# Patient Record
Sex: Female | Born: 1985 | Race: White | Hispanic: No | Marital: Married | Smoking: Never smoker
Health system: Southern US, Community
[De-identification: ages and names within clinical notes are randomized; demographics above are authoritative.]

## PROBLEM LIST (undated history)

## (undated) DIAGNOSIS — G709 Myoneural disorder, unspecified: Secondary | ICD-10-CM

## (undated) DIAGNOSIS — K219 Gastro-esophageal reflux disease without esophagitis: Secondary | ICD-10-CM

## (undated) DIAGNOSIS — Z8041 Family history of malignant neoplasm of ovary: Secondary | ICD-10-CM

## (undated) DIAGNOSIS — Z803 Family history of malignant neoplasm of breast: Secondary | ICD-10-CM

## (undated) DIAGNOSIS — T7840XA Allergy, unspecified, initial encounter: Secondary | ICD-10-CM

## (undated) HISTORY — DX: Family history of malignant neoplasm of ovary: Z80.41

## (undated) HISTORY — PX: OTHER SURGICAL HISTORY: SHX169

## (undated) HISTORY — DX: Family history of malignant neoplasm of breast: Z80.3

## (undated) HISTORY — PX: WISDOM TOOTH EXTRACTION: SHX21

## (undated) HISTORY — DX: Myoneural disorder, unspecified: G70.9

## (undated) HISTORY — DX: Allergy, unspecified, initial encounter: T78.40XA

## (undated) HISTORY — DX: Gastro-esophageal reflux disease without esophagitis: K21.9

---

## 2008-05-17 ENCOUNTER — Encounter: Admission: RE | Admit: 2008-05-17 | Discharge: 2008-05-17 | Payer: Self-pay | Admitting: Unknown Physician Specialty

## 2013-03-05 LAB — HIV 1/O/2 ABS, QUAL: 001726: NEGATIVE

## 2016-01-27 ENCOUNTER — Ambulatory Visit: Payer: Self-pay | Admitting: Osteopathic Medicine

## 2016-02-03 ENCOUNTER — Encounter: Payer: Self-pay | Admitting: Osteopathic Medicine

## 2016-02-03 ENCOUNTER — Telehealth: Payer: Self-pay

## 2016-02-03 ENCOUNTER — Ambulatory Visit (INDEPENDENT_AMBULATORY_CARE_PROVIDER_SITE_OTHER): Payer: 59 | Admitting: Osteopathic Medicine

## 2016-02-03 VITALS — BP 117/80 | HR 66 | Ht 62.0 in | Wt 154.0 lb

## 2016-02-03 DIAGNOSIS — Z8669 Personal history of other diseases of the nervous system and sense organs: Secondary | ICD-10-CM | POA: Diagnosis not present

## 2016-02-03 DIAGNOSIS — S39012A Strain of muscle, fascia and tendon of lower back, initial encounter: Secondary | ICD-10-CM

## 2016-02-03 DIAGNOSIS — Z0000 Encounter for general adult medical examination without abnormal findings: Secondary | ICD-10-CM

## 2016-02-03 DIAGNOSIS — Z87898 Personal history of other specified conditions: Secondary | ICD-10-CM

## 2016-02-03 DIAGNOSIS — X503XXA Overexertion from repetitive movements, initial encounter: Secondary | ICD-10-CM | POA: Insufficient documentation

## 2016-02-03 NOTE — Progress Notes (Signed)
HPI: Christine Fuentes is a 30 y.o. female  who presents to [facility] today, 02/03/16,  for chief complaint of:  Chief Complaint  Patient presents with  . Establish Care    herniated disc    New patient here to establish care. No major issues to discuss at this point. Reports intermittent back pain. History of herniated disc which was treated with injections in college, upper lumbars, currently lower lumbar/sacral area bothers her. 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 Spends a lot of time at her desk/sitting. Finds that picking up her small daughter causes pain every now and then. Typically relieved results the pain, she is currently using about 1 per day   Previous records reviewed: [place] 4+ VIEW Comparison: None Findings: There are six non-rib bearing vertebral bodies.  Thoracic and cervical spine views may be required for accurate vertebral body level numbering. Anatomic alignment.  No acute fracture or dislocation.  No pars defect. IMPRESSION: No acute bony injury.  Transitional anatomy is noted as delineated above.    Reports persistent vertigo, is following with [facility] Ball and throat for this issue, takes meclizine daily. Also taking Zyrtec daily, more for the reason being that it seems to help a bit with the vertigo then for uncontrolled allergies.   Past medical, surgical, social and family history reviewed: History reviewed. No pertinent past medical history. Past Surgical History:  Procedure Laterality Date  . CESAREAN SECTION     Social History  Substance Use Topics  . Smoking status: Never Smoker  . Smokeless tobacco: Never Used  . Alcohol use Not on file   Family History  Problem Relation Age of Onset  . Hypertension Mother   . Hypertension Father   . Cancer Brother   . Diabetes Maternal Grandfather      Current medication list and allergy/intolerance information reviewed:   No current  outpatient prescriptions on file.   No current facility-administered medications for this visit.    Allergies not on file    Review of Systems:  Constitutional:  No  fever, no chills, No recent illness, No unintentional weight changes. No significant fatigue.   HEENT: No  headache, no vision change, no hearing change, No sore throat, No  sinus pressure  Cardiac: No  chest pain, No  pressure, No palpitations, No  Orthopnea  Respiratory:  No  shortness of breath. No  Cough  Gastrointestinal: No  abdominal pain, No  nausea, No  vomiting,  No  blood in stool, No  diarrhea, No  constipation   Musculoskeletal: No new myalgia/arthralgia  Genitourinary: No  incontinence, No  abnormal genital bleeding, No abnormal genital discharge  Skin: No  Rash, No other wounds/concerning lesions  Hem/Onc: No  easy bruising/bleeding, No  abnormal lymph node  Endocrine: No cold intolerance,  No heat intolerance. No polyuria/polydipsia/polyphagia   Neurologic: No  weakness, No  dizziness, No  slurred speech/focal weakness/facial droop  Psychiatric: No  concerns with depression, No  concerns with anxiety, No sleep problems, No mood problems  Exam:  BP 117/80   Pulse 66   Ht 5\' 2"  (1.575 m)   Wt 154 lb (69.9 kg)   BMI 28.17 kg/m   Constitutional: VS see above. General Appearance: alert, well-developed, well-nourished, NAD  Eyes: Normal lids and conjunctive, non-icteric sclera  Ears, Nose, Mouth, Throat: MMM, Normal external inspection ears/nares/mouth/lips/gums. TM normal bilaterally. Pharynx/tonsils no erythema, no exudate. Nasal mucosa normal.  Neck: No masses, trachea midline. No thyroid enlargement. No tenderness/mass appreciated. No lymphadenopathy  Respiratory: Normal respiratory effort. no wheeze, no rhonchi, no rales  Cardiovascular: S1/S2 normal, no murmur, no rub/gallop auscultated. RRR. No lower extremity edema.   Gastrointestinal: Nontender, no masses. No hepatomegaly, no  splenomegaly. No hernia appreciated. Bowel sounds normal. Rectal exam deferred.   Musculoskeletal: Gait normal. No clubbing/cyanosis of digits.   Neurological: Normal balance/coordination. No tremor.   Skin: warm, dry, intact. No rash/ulcer. No concerning nevi or subq nodules on limited exam.    Psychiatric: Normal judgment/insight. Normal mood and affect. Oriented x3.     ASSESSMENT/PLAN:   Repetitive strain injury of lower back, initial encounter - Home exercises given, consider physical therapy. Postural adjustments discussed and proper lifting technique.  History of vertigo - Following with ENT. On meclizine and cetirizine, would advise discuss with ENT continuation of the antihistaminic agents together   Brief review of preventive care: Patient states up-to-date on Pap smear, STD testing, Tdap Vaccine, previously following with OB/GYN. Currently plans to try getting pregnant again  Visit summary with medication list and pertinent instructions was printed for patient to review. All questions at time of visit were answered - patient instructed to contact office with any additional concerns. ER/RTC precautions were reviewed with the patient. Follow-up plan: Return in about 1 year (around 02/02/2017) for annual physical, sooner if needed.

## 2016-02-03 NOTE — Telephone Encounter (Signed)
Ok to refill, would like records from ENT, however, 30 days supply ok

## 2016-02-03 NOTE — Telephone Encounter (Signed)
Patient called and would like to know if she can get a refill for Meclizine. She stated that she uses it daily for her Vertigo. Please advise if refill is approved. Christine Fuentes,CMA

## 2016-02-04 MED ORDER — MECLIZINE HCL 25 MG PO TABS: ORAL_TABLET | ORAL | 0 refills | Status: DC

## 2016-02-04 NOTE — Telephone Encounter (Signed)
Left detailed message on patient vm with advise as noted below. Mackenzee Becvar,CMA  

## 2016-03-03 ENCOUNTER — Other Ambulatory Visit: Payer: Self-pay | Admitting: Osteopathic Medicine

## 2016-04-28 ENCOUNTER — Other Ambulatory Visit: Payer: Self-pay | Admitting: Osteopathic Medicine

## 2016-06-01 ENCOUNTER — Other Ambulatory Visit: Payer: Self-pay | Admitting: Osteopathic Medicine

## 2016-07-07 ENCOUNTER — Other Ambulatory Visit: Payer: Self-pay | Admitting: Osteopathic Medicine

## 2016-08-16 ENCOUNTER — Other Ambulatory Visit: Payer: Self-pay | Admitting: Osteopathic Medicine

## 2016-09-14 ENCOUNTER — Other Ambulatory Visit: Payer: Self-pay | Admitting: Osteopathic Medicine

## 2016-10-14 ENCOUNTER — Other Ambulatory Visit: Payer: Self-pay | Admitting: Osteopathic Medicine

## 2017-04-14 ENCOUNTER — Ambulatory Visit (INDEPENDENT_AMBULATORY_CARE_PROVIDER_SITE_OTHER): Payer: 59 | Admitting: Osteopathic Medicine

## 2017-04-14 ENCOUNTER — Encounter: Payer: Self-pay | Admitting: Osteopathic Medicine

## 2017-04-14 VITALS — BP 113/69 | HR 88 | Ht 62.0 in | Wt 185.0 lb

## 2017-04-14 DIAGNOSIS — H029 Unspecified disorder of eyelid: Secondary | ICD-10-CM

## 2017-04-14 NOTE — Progress Notes (Signed)
HPI: Christine Fuentes is a 31 y.o. female who  has no past medical history on file.  she presents to [facility] today, 04/14/17,  for chief complaint of:  Chief Complaint  Patient presents with  . Eye Problem     . Context: no injury ir recent illness . Location: L lower lid . Quality:a bit red, was itchy now better, was larger now a bit better  . Assoc signs/symptoms: no eye drainage, no redness to eye itself, no vision change, no pain with eye movement.    Past medical, surgical, social and family history reviewed:  Patient Active Problem List   Diagnosis Date Noted  . Repetitive strain injury of lower back 02/03/2016  . History of vertigo 02/03/2016    Past Surgical History:  Procedure Laterality Date  . CESAREAN SECTION      Social History   Tobacco Use  . Smoking status: Never Smoker  . Smokeless tobacco: Never Used  Substance Use Topics  . Alcohol use: Not on file    Family History  Problem Relation Age of Onset  . Hypertension Mother   . Hypertension Father   . Cancer Brother   . Diabetes Maternal Grandfather      Current medication list and allergy/intolerance information reviewed:    Current Outpatient Medications  Medication Sig Dispense Refill  . cetirizine (ZYRTEC) 10 MG tablet Take 10 mg by mouth daily.    . meclizine (ANTIVERT) 25 MG tablet TAKE 1 TABLET (25 MG TOTAL) BY MOUTH 3 (THREE) TIMES A DAY AS NEEDED. 30 tablet 0   No current facility-administered medications for this visit.     No Known Allergies    Review of Systems:  Constitutional:  No  fever, no chills, No recent illness  HEENT: No  headache, no vision change, no hearing change, No sore throat, No  sinus pressure  Cardiac: No  chest pain, No  pressure  Respiratory:  No  shortness of breath. No  Cough  Skin: +Rash, No other wounds/concerning lesions  Neurologic: No  weakness, No  Dizziness    Exam:  BP 113/69   Pulse 88   Ht 5\' 2"   (1.575 m)   Wt 185 lb (83.9 kg)   BMI 33.84 kg/m   Constitutional: VS see above. General Appearance: alert, well-developed, well-nourished, NAD  Eyes: Normal conjunctiva, non-icteric sclera, EOMI, PERRLA. Small area of mild erythema w/o edema at L lower eyelid at lash line    Ears, Nose, Mouth, Throat: MMM, Normal external inspection ears/nares/mouth/lips/gums. TM normal bilaterally. Pharynx/tonsils no erythema, no exudate. Nasal mucosa normal.   Neck: No masses, trachea midline. No thyroid enlargement. No tenderness/mass appreciated. No lymphadenopathy  Respiratory: Normal respiratory effort.    ASSESSMENT/PLAN:   Eyelid abnormality    Patient Instructions  I think this is the beginning (or the end!) of a mild stye or small infection/inflammation of the gland that gives oil to the eyelashes.   Warm compresses 15 minutes at a time, 4 times per day or more  Keep eyelashes clean with warm water and baby shampoo twice per day  No makeup on lower eyelid until it's gone  If worse, call the clinic!     Visit summary with medication list and pertinent instructions was printed for patient to review. All questions at time of visit were answered - patient instructed to contact office with any additional concerns. ER/RTC precautions were reviewed with the patient. Follow-up plan: Return if symptoms worsen or fail  to improve.    Please note: voice recognition software was used to produce this document, and typos may escape review. Please contact Dr. Sheppard Coil for any needed clarifications.

## 2017-04-14 NOTE — Patient Instructions (Signed)
I think this is the beginning (or the end!) of a mild stye or small infection/inflammation of the gland that gives oil to the eyelashes.   Warm compresses 15 minutes at a time, 4 times per day or more  Keep eyelashes clean with warm water and baby shampoo twice per day  No makeup on lower eyelid until it's gone  If worse, call the clinic!

## 2017-04-15 ENCOUNTER — Encounter: Payer: Self-pay | Admitting: Osteopathic Medicine

## 2018-05-16 ENCOUNTER — Other Ambulatory Visit: Payer: Self-pay | Admitting: Osteopathic Medicine

## 2018-05-16 NOTE — Telephone Encounter (Signed)
OK to refill Should come see me for annual sometime next few months!

## 2018-05-16 NOTE — Telephone Encounter (Signed)
[facility] pharmacy requesting med refill meclizine. Last OV was 04/14/17. Pls advise, if refill is appropriate. Thanks.

## 2018-05-17 NOTE — Telephone Encounter (Signed)
Left a detailed vm msg for pt regarding med refill sent to local pharmacy. Aware of provider's note. Call back info for appt scheduling provided.

## 2018-05-30 ENCOUNTER — Encounter: Payer: Self-pay | Admitting: Gastroenterology

## 2018-05-30 ENCOUNTER — Ambulatory Visit (INDEPENDENT_AMBULATORY_CARE_PROVIDER_SITE_OTHER): Payer: 59 | Admitting: Osteopathic Medicine

## 2018-05-30 ENCOUNTER — Encounter: Payer: Self-pay | Admitting: Osteopathic Medicine

## 2018-05-30 VITALS — BP 126/76 | HR 71 | Temp 98.7°F | Wt 133.4 lb

## 2018-05-30 DIAGNOSIS — Z124 Encounter for screening for malignant neoplasm of cervix: Secondary | ICD-10-CM

## 2018-05-30 DIAGNOSIS — Z0000 Encounter for general adult medical examination without abnormal findings: Secondary | ICD-10-CM | POA: Diagnosis not present

## 2018-05-30 DIAGNOSIS — Z800 Family history of malignant neoplasm of digestive organs: Secondary | ICD-10-CM | POA: Diagnosis not present

## 2018-05-30 DIAGNOSIS — R229 Localized swelling, mass and lump, unspecified: Secondary | ICD-10-CM | POA: Diagnosis not present

## 2018-05-30 NOTE — Progress Notes (Signed)
HPI: Christine Fuentes is a 33 y.o. female who  has no past medical history on file.  she presents to [facility] today, 05/30/18,  for chief complaint of: Annual physical Lump on abdomen    Patient here for annual physical / wellness exam.  See preventive care reviewed as below.   Additional concerns today include:   Concern about a lump on L abdomen x2 weeks, nonpainful, no growth   Brother deceased from colon cancer. Grandmother had breast and ?ovrian cancer .      Past medical, surgical, social and family history reviewed:  Patient Active Problem List   Diagnosis Date Noted  . Repetitive strain injury of lower back 02/03/2016  . History of vertigo 02/03/2016    Past Surgical History:  Procedure Laterality Date  . CESAREAN SECTION      Social History   Tobacco Use  . Smoking status: Never Smoker  . Smokeless tobacco: Never Used  Substance Use Topics  . Alcohol use: Not on file    Family History  Problem Relation Age of Onset  . Hypertension Mother   . Hypertension Father   . Cancer Brother   . Diabetes Maternal Grandfather      Current medication list and allergy/intolerance information reviewed:    Current Outpatient Medications  Medication Sig Dispense Refill  . cetirizine (ZYRTEC) 10 MG tablet Take 10 mg by mouth daily.    . meclizine (ANTIVERT) 25 MG tablet TAKE 1 TABLET (25 MG TOTAL) BY MOUTH 3 (THREE) TIMES A DAY AS NEEDED. 30 tablet 0   No current facility-administered medications for this visit.     No Known Allergies    Review of Systems:  Constitutional:  No  fever, no chills, No recent illness, No unintentional weight changes. No significant fatigue.   HEENT: No  headache, no vision change, no hearing change, No sore throat, No  sinus pressure  Cardiac: No  chest pain, No  pressure, No palpitations, No  Orthopnea  Respiratory:  No  shortness of breath. No  Cough  Gastrointestinal: No  abdominal  pain, No  nausea, No  vomiting,  No  blood in stool, No  diarrhea, No  constipation   Musculoskeletal: No new myalgia/arthralgia  Skin: No  Rash, No other wounds/concerning lesions  Genitourinary: No  incontinence, No  abnormal genital bleeding, No abnormal genital discharge  Hem/Onc: No  easy bruising/bleeding, No  abnormal lymph node, +bump as per HPI on abdomen   Endocrine: No cold intolerance,  No heat intolerance. No polyuria/polydipsia/polyphagia   Neurologic: No  weakness, No  dizziness, No  slurred speech/focal weakness/facial droop  Psychiatric: No  concerns with depression, No  concerns with anxiety, No sleep problems, No mood problems  Exam:  BP 126/76 (BP Location: Left Arm, Patient Position: Sitting, Cuff Size: Normal)   Pulse 71   Temp 98.7 F (37.1 C) (Oral)   Wt 133 lb 6.4 oz (60.5 kg)   BMI 24.40 kg/m   Constitutional: VS see above. General Appearance: alert, well-developed, well-nourished, NAD  Eyes: Normal lids and conjunctive, non-icteric sclera  Ears, Nose, Mouth, Throat: MMM, Normal external inspection ears/nares/mouth/lips/gums. TM normal bilaterally. Pharynx/tonsils no erythema, no exudate. Nasal mucosa normal.   Neck: No masses, trachea midline. No thyroid enlargement. No tenderness/mass appreciated. No lymphadenopathy  Respiratory: Normal respiratory effort. no wheeze, no rhonchi, no rales  Cardiovascular: S1/S2 normal, no murmur, no rub/gallop auscultated. RRR. No lower extremity edema.   Gastrointestinal: Nontender. No hepatomegaly, no  splenomegaly. No hernia appreciated. Bowel sounds normal. Rectal exam deferred. Small blueberry-sized subq round mass LLQ above ASIS, faint healing bruise superficial to this, nontender, no discoloration.   Musculoskeletal: Gait normal. No clubbing/cyanosis of digits.   Neurological: Normal balance/coordination. No tremor. No cranial nerve deficit on limited exam. Motor and sensation intact and symmetric. Cerebellar  reflexes intact.   Skin: warm, dry, intact. No rash/ulcer. No concerning nevi or subq nodules on limited exam.    Psychiatric: Normal judgment/insight. Normal mood and affect. Oriented x3.    No results found for this or any previous visit (from the past 72 hour(s)).  No results found.   ASSESSMENT/PLAN: The primary encounter diagnosis was Annual physical exam. Diagnoses of Cervical cancer screening, Subcutaneous mass, and Family history of colon cancer were also pertinent to this visit.  Orders Placed This Encounter  Procedures  . US Abdomen Limited  . CBC  . COMPLETE METABOLIC PANEL WITH GFR  . Lipid panel  . Ambulatory referral to Gastroenterology      Patient Instructions  General Preventive Care  Most recent routine screening lipids/other labs: ordered today.   Everyone should have blood pressure checked once per year.   Tobacco: don't!   Alcohol: responsible moderation is ok for most adults - if you have concerns about your alcohol intake, please talk to me!   Exercise: as tolerated to reduce risk of cardiovascular disease and diabetes. Strength training will also prevent osteoporosis.   Mental health: if need for mental health care (medicines, counseling, other), or concerns about moods, please let me know!   Sexual health: if need for STD testing, or if concerns with libido/pain problems, please let me know! If you need to discuss your birth control options, please let me know!   Advanced Directive: Living Will and/or Healthcare Power of Attorney recommended for all adults, regardless of age or health.  Vaccines  Flu vaccine: recommended for almost everyone, every fall.   Shingles vaccine: Shingrix recommended after age 23.  Pneumonia vaccines: Prevnar and Pneumovax recommended after age 19, or sooner if certain medical conditions.  Tetanus booster: Tdap recommended every 10 years.   HPV vaccine: Gardasil recommended up to age 11 to prevent HPV-associated  diseases, including certain cancers.  Cancer screenings   Colon cancer screening: recommended for everyone at age 42, but some folks need a colonoscopy sooner if risk factors - will refer to GI  Breast cancer screening: mammogram per OBGYN   Cervical cancer screening: Pap per OBGYN  Lung cancer screening: not needed for non-smokers  Infection screenings . HIV: recommended screening at least once age 89-65, more often as needed. . Gonorrhea/Chlamydia: screening as needed, though many insurances ask that we perform testing for anyone on birth control. . Hepatitis C: recommended for anyone born 27-1963 . TB: certain at-risk populations, or depending on work requirements and/or travel history Other . Bone Density Test: recommended for women at age 33       Visit summary with medication list and pertinent instructions was printed for patient to review. All questions at time of visit were answered - patient instructed to contact office with any additional concerns or updates. ER/RTC precautions were reviewed with the patient.     Please note: voice recognition software was used to produce this document, and typos may escape review. Please contact Dr. Sheppard Coil for any needed clarifications.     Follow-up plan: Return in about 1 year (around 05/31/2019) for [place], sooner if needed .

## 2018-05-30 NOTE — Patient Instructions (Addendum)
General Preventive Care  Most recent routine screening lipids/other labs: ordered today.   Everyone should have blood pressure checked once per year.   Tobacco: don't!   Alcohol: responsible moderation is ok for most adults - if you have concerns about your alcohol intake, please talk to me!   Exercise: as tolerated to reduce risk of cardiovascular disease and diabetes. Strength training will also prevent osteoporosis.   Mental health: if need for mental health care (medicines, counseling, other), or concerns about moods, please let me know!   Sexual health: if need for STD testing, or if concerns with libido/pain problems, please let me know! If you need to discuss your birth control options, please let me know!   Advanced Directive: Living Will and/or Healthcare Power of Attorney recommended for all adults, regardless of age or health.  Vaccines  Flu vaccine: recommended for almost everyone, every fall.   Shingles vaccine: Shingrix recommended after age 80.  Pneumonia vaccines: Prevnar and Pneumovax recommended after age 15, or sooner if certain medical conditions.  Tetanus booster: Tdap recommended every 10 years.   HPV vaccine: Gardasil recommended up to age 43 to prevent HPV-associated diseases, including certain cancers.  Cancer screenings   Colon cancer screening: recommended for everyone at age 33, but some folks need a colonoscopy sooner if risk factors - will refer to GI  Breast cancer screening: mammogram per OBGYN   Cervical cancer screening: Pap per OBGYN  Lung cancer screening: not needed for non-smokers  Infection screenings . HIV: recommended screening at least once age 1-65, more often as needed. . Gonorrhea/Chlamydia: screening as needed, though many insurances ask that we perform testing for anyone on birth control. . Hepatitis C: recommended for anyone born 59-1963 . TB: certain at-risk populations, or depending on work requirements and/or travel  history Other . Bone Density Test: recommended for women at age 55

## 2018-06-06 ENCOUNTER — Encounter: Payer: 59 | Admitting: Osteopathic Medicine

## 2018-06-06 ENCOUNTER — Ambulatory Visit (HOSPITAL_BASED_OUTPATIENT_CLINIC_OR_DEPARTMENT_OTHER)
Admission: RE | Admit: 2018-06-06 | Discharge: 2018-06-06 | Disposition: A | Discharge status: Home / Self Care | Payer: 59 | Source: Ambulatory Visit | Attending: Osteopathic Medicine | Admitting: Osteopathic Medicine

## 2018-06-06 DIAGNOSIS — R229 Localized swelling, mass and lump, unspecified: Secondary | ICD-10-CM | POA: Diagnosis present

## 2018-06-06 LAB — CBC
HEMATOCRIT: 42 % (ref 35.0–45.0)
HEMOGLOBIN: 14.4 g/dL (ref 11.7–15.5)
MCH: 29.9 pg (ref 27.0–33.0)
MCHC: 34.3 g/dL (ref 32.0–36.0)
MCV: 87.3 fL (ref 80.0–100.0)
MPV: 10.9 fL (ref 7.5–12.5)
Platelets: 314 10*3/uL (ref 140–400)
RBC: 4.81 10*6/uL (ref 3.80–5.10)
RDW: 12.4 % (ref 11.0–15.0)
WBC: 4.9 10*3/uL (ref 3.8–10.8)

## 2018-06-06 LAB — LIPID PANEL
CHOL/HDL RATIO: 2.9 (calc) (ref <5.0)
CHOLESTEROL: 166 mg/dL (ref <200)
HDL: 58 mg/dL (ref >50)
LDL Cholesterol (Calc): 94 mg/dL (calc)
Non-HDL Cholesterol (Calc): 108 mg/dL (calc) (ref <130)
Triglycerides: 48 mg/dL (ref <150)

## 2018-06-06 LAB — COMPLETE METABOLIC PANEL WITH GFR
AG RATIO: 2.1 (calc) (ref 1.0–2.5)
ALBUMIN MSPROF: 4.5 g/dL (ref 3.6–5.1)
ALT: 9 U/L (ref 6–29)
AST: 10 U/L (ref 10–30)
Alkaline phosphatase (APISO): 39 U/L (ref 33–115)
BILIRUBIN TOTAL: 0.6 mg/dL (ref 0.2–1.2)
BUN: 14 mg/dL (ref 7–25)
CHLORIDE: 105 mmol/L (ref 98–110)
CO2: 27 mmol/L (ref 20–32)
Calcium: 9.7 mg/dL (ref 8.6–10.2)
Creat: 0.81 mg/dL (ref 0.50–1.10)
GFR, Est African American: 113 mL/min/{1.73_m2} (ref >=60)
GFR, Est Non African American: 97 mL/min/{1.73_m2} (ref >=60)
GLOBULIN: 2.1 g/dL (ref 1.9–3.7)
Glucose, Bld: 90 mg/dL (ref 65–99)
POTASSIUM: 4.7 mmol/L (ref 3.5–5.3)
SODIUM: 139 mmol/L (ref 135–146)
Total Protein: 6.6 g/dL (ref 6.1–8.1)

## 2018-06-16 ENCOUNTER — Other Ambulatory Visit: Payer: Self-pay | Admitting: Osteopathic Medicine

## 2018-06-16 ENCOUNTER — Ambulatory Visit (INDEPENDENT_AMBULATORY_CARE_PROVIDER_SITE_OTHER): Payer: 59 | Admitting: Gastroenterology

## 2018-06-16 ENCOUNTER — Encounter: Payer: Self-pay | Admitting: Gastroenterology

## 2018-06-16 VITALS — BP 114/74 | HR 99 | Ht 62.0 in | Wt 131.5 lb

## 2018-06-16 DIAGNOSIS — Z800 Family history of malignant neoplasm of digestive organs: Secondary | ICD-10-CM | POA: Diagnosis not present

## 2018-06-16 MED ORDER — SOD PICOSULFATE-MAG OX-CIT ACD 10-3.5-12 MG-GM -GM/160ML PO SOLN: 1.0000 | ORAL | 0 refills | Status: DC

## 2018-06-16 NOTE — Patient Instructions (Signed)
If you are age 31 or older, your body mass index should be between 23-30. Your Body mass index is 24.05 kg/m. If this is out of the aforementioned range listed, please consider follow up with your Primary Care Provider.  If you are age 8 or younger, your body mass index should be between 19-25. Your Body mass index is 24.05 kg/m. If this is out of the aformentioned range listed, please consider follow up with your Primary Care Provider.   Please call our office at [phone] to set up your Colonoscopy if you decided to proceed.  It was a pleasure to see you today!  Vito Cirigliano, D.O.

## 2018-06-16 NOTE — Progress Notes (Signed)
Chief Complaint: FHx of GI malignancy  Referring Provider:     Emeterio Reeve, DO  HPI:     Christine Fuentes is a 33 y.o. female with a strong FHX of [place] referred to the [facility] for evaluation of CRC screening. FHx n/f brother diagnosed at age 34 with appendical CA- Signet Ring Mucinous Adenocarcinoma. Died age 68 from mets.   She is otherwise without any GI symptoms.  She denies nausea, vomiting, diarrhea, constipation, hematochezia, melena, night sweats, fever, chills, weight loss, early satiety, dysphagia, odynophagia.  No previous EGD or colonoscopy.  Recent labs n/f normal CBC and CMP.   Family history: -Brother: Signet Ring Mucinous Adenocarcinoma, diagnosed age 37, deceased age 4 -MGM: Breast, Ovarian, Lung CA   History reviewed. No pertinent past medical history.   Past Surgical History:  Procedure Laterality Date  . CESAREAN SECTION     x2   Family History  Problem Relation Age of Onset  . Hypertension Mother   . Colonic polyp Mother   . Hypertension Father   . Colonic polyp Father   . Cancer Brother        appendix died at 78  . Diabetes Maternal Grandfather   . Breast cancer Maternal Grandmother   . Ovarian cancer Maternal Grandmother   . Esophageal cancer Neg Hx    Social History   Tobacco Use  . Smoking status: Never Smoker  . Smokeless tobacco: Never Used  Substance Use Topics  . Alcohol use: Not Currently  . Drug use: Never   Current Outpatient Medications  Medication Sig Dispense Refill  . cetirizine (ZYRTEC) 10 MG tablet Take 10 mg by mouth daily.    Marland Kitchen levonorgestrel (MIRENA) 20 MCG/24HR IUD 1 each by Intrauterine route once. Placed 10/2016    . meclizine (ANTIVERT) 25 MG tablet TAKE 1 TABLET (25 MG TOTAL) BY MOUTH 3 (THREE) TIMES A DAY AS NEEDED. 30 tablet 0   No current facility-administered medications for this visit.    No Known Allergies   Review of Systems: All systems reviewed and negative except  where noted in HPI.     Physical Exam:    Wt Readings from Last 3 Encounters:  06/16/18 131 lb 8 oz (59.6 kg)  05/30/18 133 lb 6.4 oz (60.5 kg)  04/14/17 185 lb (83.9 kg)    BP 114/74   Pulse 99   Ht 5\' 2"  (1.575 m)   Wt 131 lb 8 oz (59.6 kg)   BMI 24.05 kg/m  Constitutional:  Pleasant, in no acute distress. Psychiatric: Normal mood and affect. Behavior is normal. EENT: Pupils normal.  Conjunctivae are normal. No scleral icterus. Neck supple. No cervical LAD. Cardiovascular: Normal rate, regular rhythm. No edema Pulmonary/chest: Effort normal and breath sounds normal. No wheezing, rales or rhonchi. Abdominal: Soft, nondistended, nontender. Bowel sounds active throughout. There are no masses palpable. No hepatomegaly. Neurological: Alert and oriented to person place and time. Skin: Skin is warm and dry. No rashes noted.   ASSESSMENT AND PLAN;   Christine Fuentes is a 33 y.o. 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  She is otherwise without GI symptoms.  Discussed her family history at length.  No clear consensus guidelines for screening family members with this type of malignancy.  However, given her brother's young age at diagnosis, the relative difficulty in establishing that diagnosis, and the  potential for unknown genetic factors that are yet to be discovered, I did offer consideration for colonoscopy.  Again, if this was a family history of CRC, the guidelines are clear and would be appropriate to start early screening now.  However, given the histology of his malignancy, there is no clear societal guideline, although there are emerging data to support the use of genetic and endoscopic screening, as outlined below.  She understood the conversation well (has a medical background as a Marine scientist) and discussion with her family, would like to proceed with colonoscopy.   Additionally we  discussed her maternal grandmother's history of breast and ovarian cancer.  There is overlap between breast/ovarian cancer and colon cancer (HNPCC), but family history does not seem to support Lynch.  However, there is also overlap between breast cancer and signet ring gastric cancer with a CDH1 gene mutation.  Does not sound like the brother had primary gastric cancer, but signet rings are difficult to diagnose and with his metastatic disease, suppose this is plausible, in which case referral to a Dietitian and endoscopic studies are very reasonable.    Finally, found at least one publication citing there is some potential for increased risk of appendiceal signet ring and colorectal cancer with CDH1 mutations.  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   That same publication also recommends colonoscopy every 3-5 years starting at age 32 (or 76 years younger than the youngest colon cancer diagnosis) if the family history includes close relatives with mucinous or signet ring cell colon cancer, along with additional screening studies for those that are CDH1 mutation carriers (breast CA screening, annual EGD, etc).   Given her family history, emerging literature, and patient/family concerns, feel that it is quite reasonable to proceed with colonoscopy and Genetics referral at this time, with plan for EGD pending Genetics eval.   - Set up for colonoscopy - Referral to Genetic Counselor - RTC after studies and eval  The indications, risks, and benefits of colonoscopy were explained to the patient in detail. Risks include but are not limited to bleeding, perforation, adverse reaction to medications, and cardiopulmonary compromise. Sequelae include but are not limited to the possibility of surgery, hospitalization, and mortality. The patient verbalized understanding  and wished to proceed. All questions answered, referred to the scheduler and bowel prep ordered. Further recommendations pending results of the exam.    I spent a total of 30 minutes of face-to-face time with the patient. Greater than 50% of the time was spent counseling and coordinating care.    Lavena Bullion, DO, FACG  06/16/2018, 1:22 PM   Emeterio Reeve, DO

## 2018-06-19 ENCOUNTER — Telehealth: Payer: Self-pay | Admitting: Genetics

## 2018-06-19 ENCOUNTER — Encounter: Payer: Self-pay | Admitting: Genetics

## 2018-06-19 NOTE — Telephone Encounter (Signed)
Received a genetic counseling referral from Dr. Bryan Lemma for fhx og Gi malignancy. Pt has been cld and scheduled to see Ferol Luz on 3/31 at 10am. Pt aware to arrive 15 minutes early. Letter mailed.

## 2018-07-24 ENCOUNTER — Other Ambulatory Visit: Payer: Self-pay | Admitting: Osteopathic Medicine

## 2018-08-04 ENCOUNTER — Encounter: Payer: 59 | Admitting: Gastroenterology

## 2018-08-08 ENCOUNTER — Other Ambulatory Visit: Payer: 59

## 2018-08-08 ENCOUNTER — Encounter: Payer: 59 | Admitting: Genetic Counselor

## 2018-11-27 ENCOUNTER — Other Ambulatory Visit: Payer: Self-pay | Admitting: Osteopathic Medicine

## 2019-03-29 ENCOUNTER — Other Ambulatory Visit: Payer: Self-pay | Admitting: Osteopathic Medicine

## 2019-06-04 ENCOUNTER — Other Ambulatory Visit: Payer: Self-pay | Admitting: Osteopathic Medicine

## 2019-06-04 NOTE — Telephone Encounter (Signed)
Notes to clinic:  This medications is not able to be delegated.    Requested Prescriptions  Pending Prescriptions Disp Refills   meclizine (ANTIVERT) 25 MG tablet [Pharmacy Med Name: MECLIZINE 25 MG TABLET] 30 tablet 1    Sig: TAKE 1 TABLET BY MOUTH THREE TIMES A DAY AS NEEDED      Not Delegated - Gastroenterology: Antiemetics Failed - 06/04/2019  8:41 AM      Failed - This refill cannot be delegated      Failed - Valid encounter within last 6 months    Recent Outpatient Visits           1 year ago Annual physical exam   [facility], Lanelle Bal, DO   2 years ago Eyelid abnormality   [facility], Lanelle Bal, DO   3 years ago Repetitive strain injury of lower back, initial encounter   [facility], [place], DO

## 2019-06-21 IMAGING — US US ABDOMEN LIMITED
1 series · 14 of 25 positions shown · non-contrast
Comparison: None.

CLINICAL DATA: Left lower anterior abdominal wall mass for the past
3 weeks, slowly decreasing in size. Initial bruising over the mass.

EXAM:
ULTRASOUND ABDOMEN LIMITED

[Series 1: us abdomen limited · 0.03mm/px · 26 acquisitions, 14 frames shown]
[im 1/26]
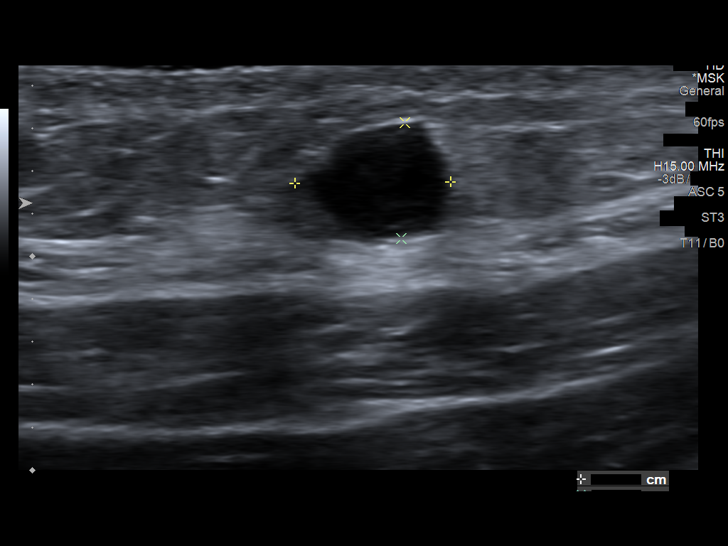
[im 3/26]
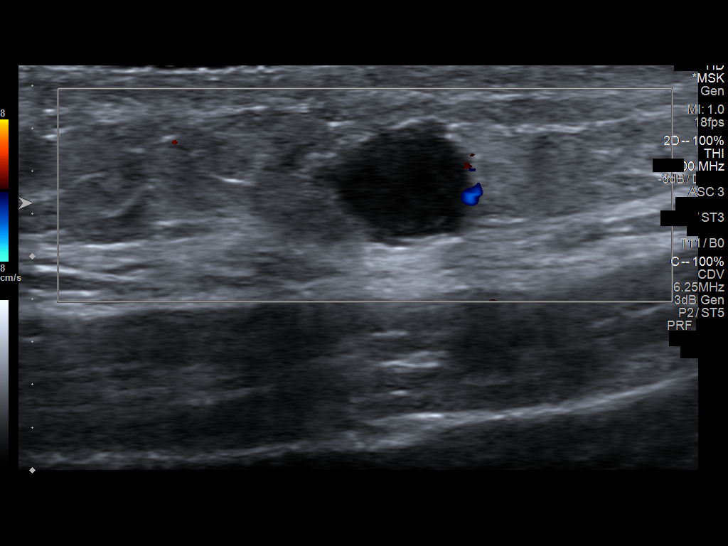
[im 5/26]
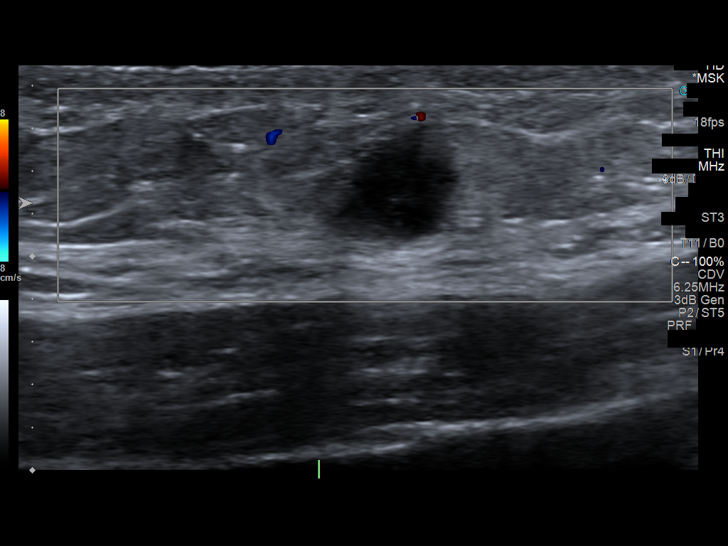
[im 7/26]
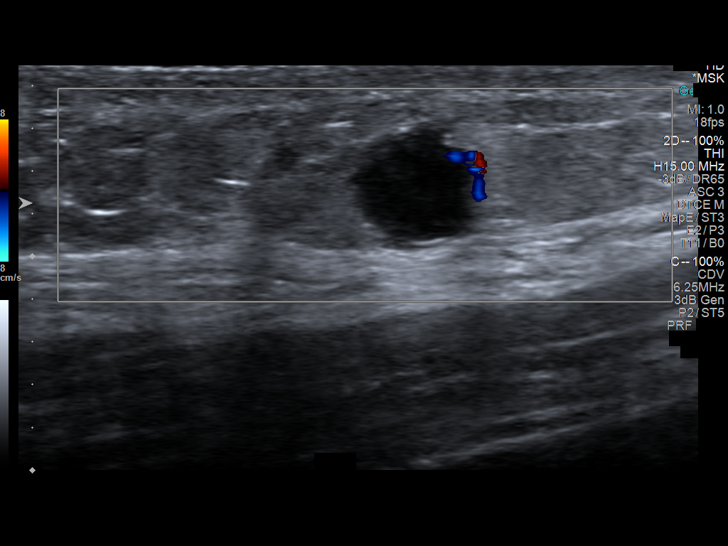
[im 9/26]
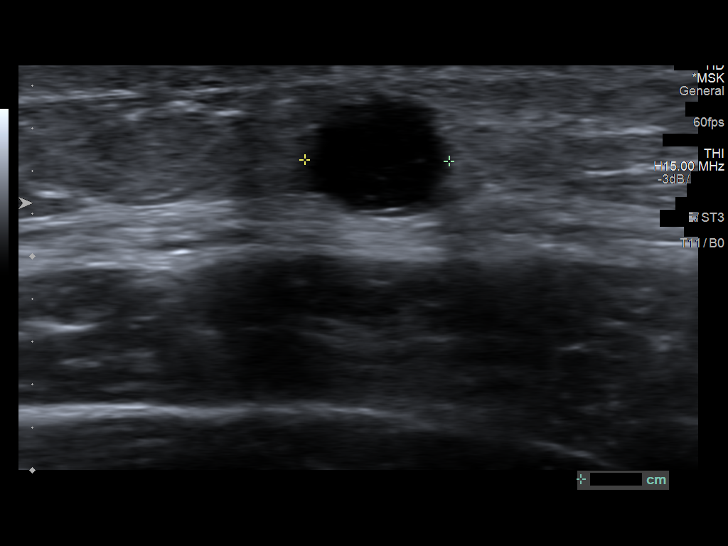
[im 10/26]
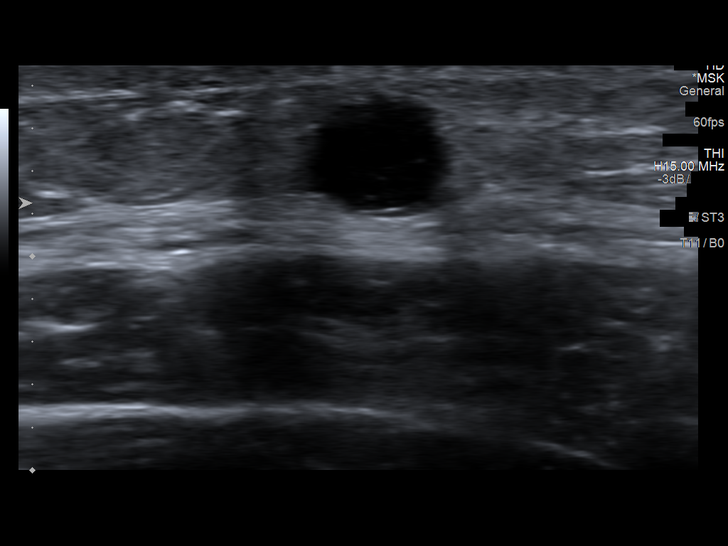
[im 12/26]
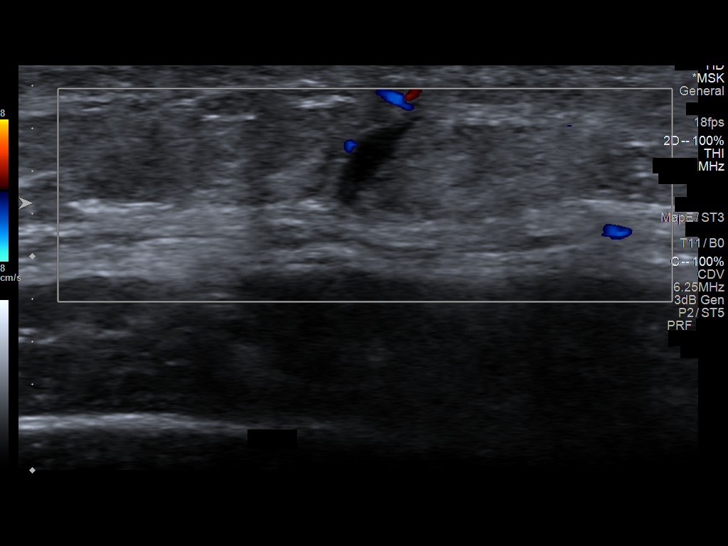
[im 14/26]
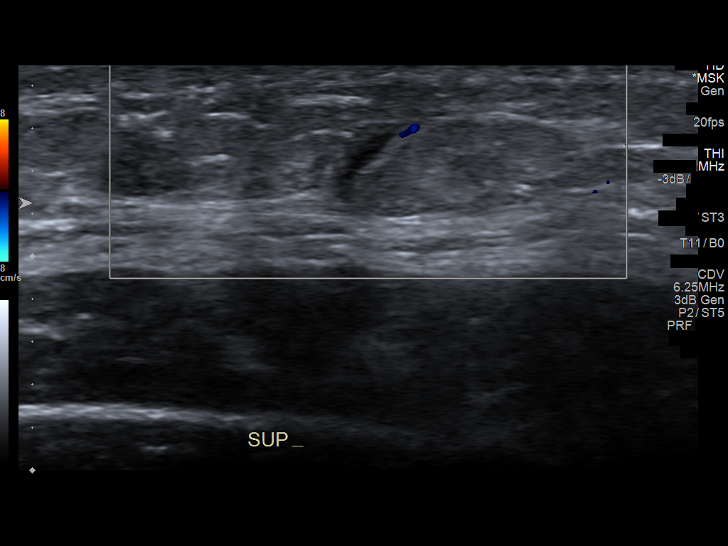
[im 16/26]
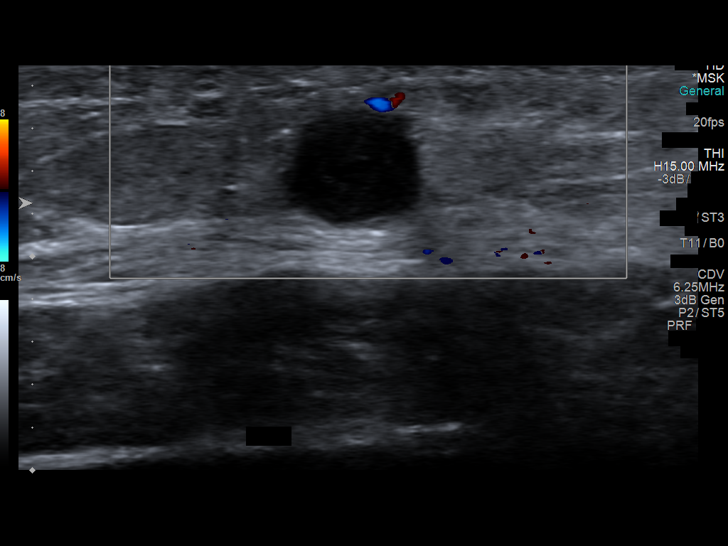
[im 17/26]
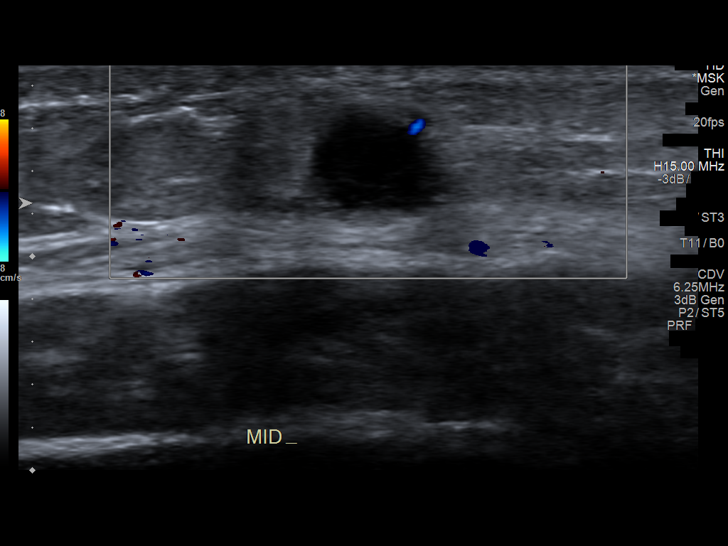
[im 19/26]
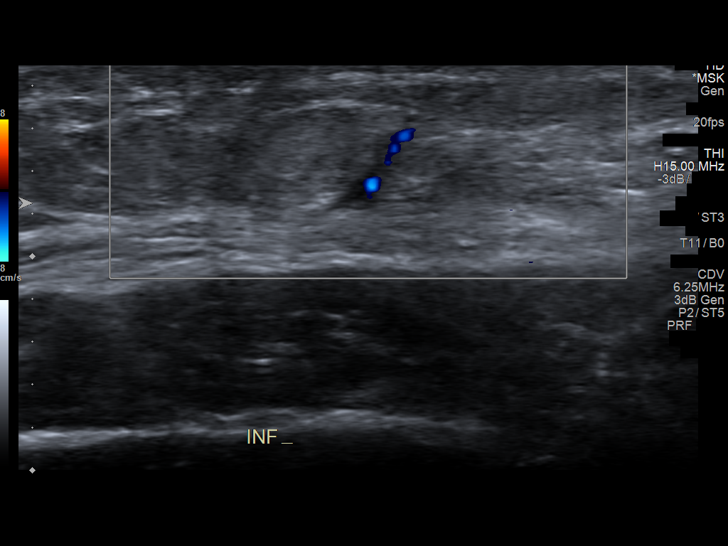
[im 21/26]
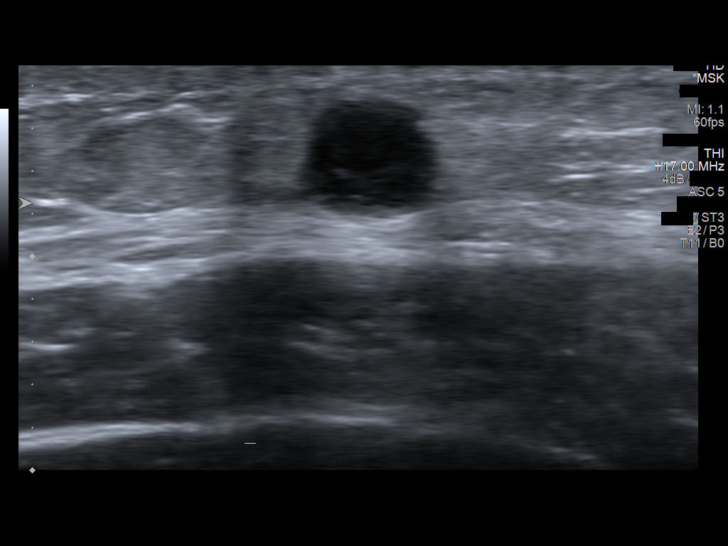
[im 23/26]
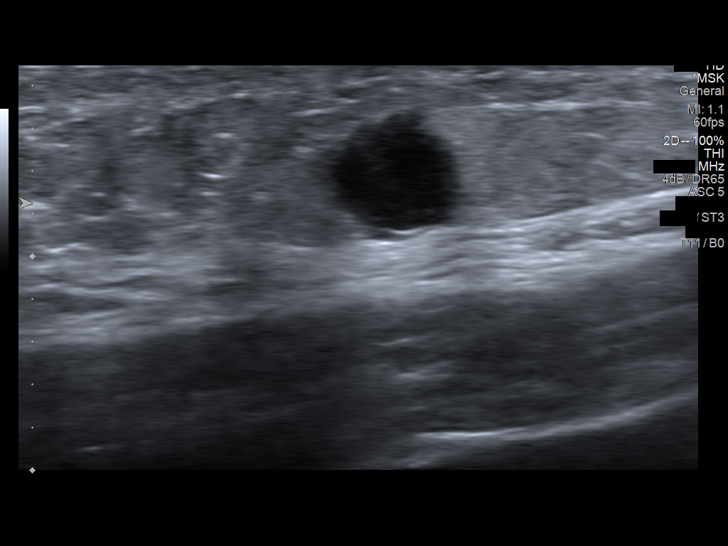
[im 26/26]
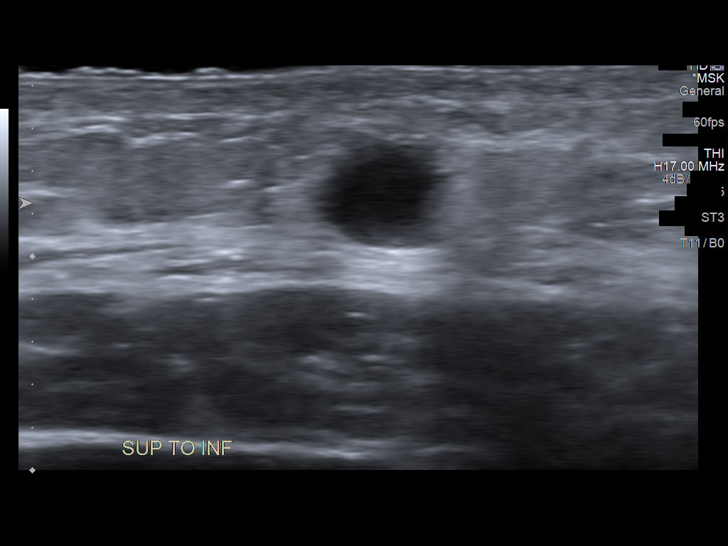

[14 of 25 positions shown; findings below may reference images not displayed]

FINDINGS: Focused ultrasound in the area of clinical concern along the left
lateral anterior abdominal wall demonstrates a superficial
subcutaneous 7 x 5 x 7 mm round, well-defined, anechoic and
hypoechoic mass with posterior acoustic enhancement and no internal
vascularity. There is some peripheral vascularity adjacent to the
mass.
IMPRESSION: 1. 7 mm well-defined cystic mass in the superficial subcutaneous fat
of the left lateral anterior abdominal wall. Given clinical history
of initial bruising over the mass and decreasing size of mass, this
may reflect a resolving hematoma. Clinical follow-up to resolution
is recommended.

## 2019-07-03 ENCOUNTER — Ambulatory Visit (INDEPENDENT_AMBULATORY_CARE_PROVIDER_SITE_OTHER): Payer: 59 | Admitting: Physician Assistant

## 2019-07-03 ENCOUNTER — Encounter: Payer: Self-pay | Admitting: Physician Assistant

## 2019-07-03 ENCOUNTER — Other Ambulatory Visit: Payer: Self-pay

## 2019-07-03 VITALS — Ht 62.0 in | Wt 150.0 lb

## 2019-07-03 DIAGNOSIS — F419 Anxiety disorder, unspecified: Secondary | ICD-10-CM

## 2019-07-03 DIAGNOSIS — R42 Dizziness and giddiness: Secondary | ICD-10-CM

## 2019-07-03 DIAGNOSIS — H9072 Mixed conductive and sensorineural hearing loss, unilateral, left ear, with unrestricted hearing on the contralateral side: Secondary | ICD-10-CM

## 2019-07-03 DIAGNOSIS — F4321 Adjustment disorder with depressed mood: Secondary | ICD-10-CM | POA: Diagnosis not present

## 2019-07-03 DIAGNOSIS — Z79899 Other long term (current) drug therapy: Secondary | ICD-10-CM | POA: Diagnosis not present

## 2019-07-03 DIAGNOSIS — H9312 Tinnitus, left ear: Secondary | ICD-10-CM

## 2019-07-03 DIAGNOSIS — H9192 Unspecified hearing loss, left ear: Secondary | ICD-10-CM

## 2019-07-03 DIAGNOSIS — R4681 Obsessive-compulsive behavior: Secondary | ICD-10-CM

## 2019-07-03 MED ORDER — SERTRALINE HCL 25 MG PO TABS: 25.0000 mg | ORAL_TABLET | Freq: Every day | ORAL | 1 refills | Status: DC

## 2019-07-03 MED ORDER — TRIAMTERENE-HCTZ 37.5-25 MG PO TABS: 1.0000 | ORAL_TABLET | Freq: Every day | ORAL | 1 refills | Status: DC

## 2019-07-03 MED ORDER — FLUTICASONE PROPIONATE 50 MCG/ACT NA SUSP: 2.0000 | Freq: Every day | NASAL | 6 refills | Status: DC

## 2019-07-03 NOTE — Patient Instructions (Addendum)
Meniere Disease  Meniere disease is an inner ear disorder. It causes attacks of a spinning sensation (vertigo), dizziness, and ringing in the ear (tinnitus). It also causes hearing loss and a feeling of fullness or pressure in the ear. This is a lifelong condition, and it may get worse over time. You may have drop attacks or severe dizziness that makes you fall. A drop attack is when you suddenly fall without losing consciousness and you quickly recover after a few seconds or minutes. What are the causes? This condition is caused by having too much of the fluid that is in your inner ear (endolymph). When fluid builds up in your inner ear, it affects the nerves that control balance and hearing. The reason for the fluid buildup is not known. Possible causes include:  Allergies.  An abnormal reaction of the body's defense system (autoimmune disease).  Viral infection of the inner ear.  Head injury. What increases the risk? You are more likely to develop this condition if:  You are older than age 40.  You have a family history of Meniere disease.  You have a history of autoimmune disease.  You have a history of migraine headaches. What are the signs or symptoms? Symptoms of this condition can come and go and may last for up to 4 hours at a time. Symptoms usually start in one ear. They may become more frequent and eventually involve both ears. Symptoms can include:  Fullness and pressure in your ear.  Roaring or ringing in your ear.  Vertigo and loss of balance.  Dizziness.  Decreased hearing.  Nausea and vomiting. How is this diagnosed? This condition is diagnosed based on:  A physical exam.  Tests , such as: ? A hearing test (audiogram). ? An electronystagmogram. This tests your balance nerve (vestibular nerve). ? Imaging studies of your inner ear, such as CT scan or MRI. ? Other balance tests, such as rotational or balance platform tests. How is this treated? There is  no cure for this condition, but treatment can help to manage your symptoms. Treatment may include:  A low-salt diet. Limiting salt may help to reduce fluid in the body and relieve symptoms.  Oral or injected medicines to reduce or control: ? Vertigo. ? Nausea. ? Fluid retention. ? Dizziness.  Use of an air pressure pulse generator. This is a machine that sends small pressure pulses into your ear canal.  Hearing aids.  Inner ear surgery. This is rare. When you have symptoms, it can be helpful to lie down on a flat surface and focus your eyes on one object that does not move. Try to stay in that position until your symptoms go away. Follow these instructions at home: Eating and drinking  Eat the same amount of food at the same time every day, including snacks.  Do not skip meals.  Avoid caffeine.  Drink enough fluids to keep your urine clear or pale yellow.  Limit alcoholic drinks to one drink a day for non-pregnant women and 2 drinks a day for men. One drink equals 12 oz of beer, 5 oz of wine, or 1 oz of hard liquor.  Limit the salt (sodium) in your diet as told by your health care provider. Check ingredients and nutrition facts on packaged foods and beverages.  Do not eat foods that contain monosodium glutamate (MSG). General instructions  Do not use any products that contain nicotine or tobacco, such as cigarettes and e-cigarettes. If you need help quitting, ask your   health care provider.  Take over-the-counter and prescription medicines only as told by your health care provider.  Find ways to reduce or avoid stress. If you need help with this, ask your health care provider.  Do not drive if you have vertigo or dizziness. Contact a health care provider if:  You have symptoms that last longer than 4 hours.  You have new or worse symptoms. Get help right away if:  You have been vomiting for 24 hours.  You cannot keep fluids down.  You have chest pain or trouble  breathing. Summary  Meniere disease is an inner ear disorder. It causes attacks of a spinning sensation (vertigo), dizziness, and ringing in the ear (tinnitus). It also causes hearing loss and a feeling of fullness or pressure in the ear.  Symptoms of this condition can come and go and may last for up to 4 hours at a time.  When you have symptoms, it can be helpful to lie down on a flat surface and focus your eyes on one object that does not move. Try to stay in that position until your symptoms go away. This information is not intended to replace advice given to you by your health care provider. Make sure you discuss any questions you have with your health care provider. Document Revised: 04/08/2017 Document Reviewed: 03/17/2016 Elsevier Patient Education  2020 [place].   Managing Obsessive-Compulsive Disorder If you have been diagnosed with obsessive-compulsive disorder (OCD), you may be relieved that you now know why you have felt or behaved a certain way. You may also feel overwhelmed about the treatment ahead, how to get the support you need, and how to deal with the condition day-to-day. With treatment and support, you can manage your OCD. How to manage lifestyle changes Managing stress Stress is your body's reaction to life changes and events, both good and bad. Stress can play a major role in OCD, so it is important to learn how to manage stress. Some techniques to manage stress include:  Meditation, muscle relaxation, and breathing exercises.  Exercise. Even a short daily walk can help to lower stress levels.  Getting enough good-quality sleep.  Spending time on hobbies that you enjoy.  Accepting and letting go of things that you cannot change. To help you manage stress associated with OCD, your health care provider may recommend exposure and response prevention therapy. In this therapy, you will be exposed to the distressing situation that triggers your compulsion and be  prevented from responding to it. With repetition of this process over time, you will no longer feel the distress or need to perform the compulsion. Medicines Your health care provider may suggest certain medicines for depression (antidepressants) if he or she feels that they will help to improve your condition. Avoid using alcohol and other substances that may prevent your medicines from working properly. It is also important to:  Talk with your pharmacist or health care provider about all medicines that you take, their possible side effects, and which medicines are safe to take together.  Make it your goal to take part in all treatment decisions (shared decision-making). Ask about possible side effects of medicines that your health care provider recommends, and tell him or her how you feel about having those side effects. It is best if shared decision-making with your health care provider is part of your total treatment plan. If you are taking medicines as part of your treatment, do not stop taking them before you ask your health  care provider if it is safe to stop. You may need to have the medicine slowly decreased (tapered) over time to lower the risk of harmful side effects. Relationships Consider giving education materials to friends and family. Your family and friends may need to learn about your OCD in order for them to understand you and support you as you manage your condition. Family therapy may also help to lower stress and relieve tension. How to recognize changes in your condition Some signs that your condition may be getting worse include:  Being anxious about germs or dirt.  Having harmful thoughts about yourself or others.  Making sure that household objects are alike or perfectly organized in a specific way.  Having great difficulty making decisions, or second-guessing yourself after making a decision.  Constant cleaning and handwashing.  Repeating behavior such as repeatedly  checking to see if a door is locked or the oven is off.  Counting nonstop or uncontrollably. Follow these instructions at home:  Medicines  Take over-the-counter and prescription medicines only as told by your health care provider.  Check with your health care provider before starting any new prescription or over-the-counter medicines. General instructions  Ask for support from trusted family members or friends to make sure you stay on track with your treatment.  Keep a journal to write down your daily moods, medicines, sleep habits, and life events. Doing this may help you have more success with your treatment.  Maintain a healthy lifestyle. Eat a healthy diet, exercise regularly, get plenty of sleep, and take time to relax.  Keep all follow-up visits as told by your health care provider and therapist. This is important. Where to find support Talking to others It may be difficult to tell loved ones about your condition, but they can be a good support system for you. You can work with your therapist to decide whom to tell and when to tell them. Here are some tips for starting the conversation:  Start by sharing your experience with OCD. It is up to you how much detail you want to provide.  Let your loved ones know that you are seeking treatment.  Do not expect loved ones to understand your condition right away. Finances Not all insurance plans cover mental health care, so it is important to check with your insurance carrier. If paying for co-pays or counseling services is a problem, search for a local or county mental health care center. Public mental health care services may be offered there at a low cost or no cost when you are not able to see a private health care provider. If you are taking medicine for depression, you may be able to get the generic form, which may be less expensive than brand-name medicine. Some makers of prescription medicines also offer help to patients who  cannot afford the medicines they need. Questions to ask your health care provider  If you are taking medicines: ? How long do I need to take medicine? ? Are there any long-term side effects of my medicine? ? Are there any alternatives to taking medicine?  How would I benefit from therapy?  How often should I follow up with a health care provider? Where to find more information  International OCD Foundation: www.iocdf.[place] [employer] Mental Illness (NAMI): www.nami.org Contact a health care provider if:  Your symptoms get worse or they do not get better with treatment.  You develop new symptoms. Get help right away if:  You have severe  side effects after taking your medicine.  You have thoughts about hurting yourself or others. If you ever feel like you may hurt yourself or others, or have thoughts about taking your own life, get help right away. You can go to your nearest emergency department or call:  Your local emergency services (911 in the [place]  A suicide crisis helpline, such as the [place] at (985)442-0653. This is open 24 hours a day. Summary  Stress can play a major role in obsessive-compulsive disorder (OCD). Learning ways to manage stress may help your treatment work better for you.  If you are taking medicines as part of your treatment, do not stop taking medicines before you ask your health care provider if it is safe to stop.  When talking with family members and friends about your OCD, decide how much detail you want to give them and be patient as they work to understand your condition.  Keep all follow-up visits as told by your health care provider and therapist. This is important. This information is not intended to replace advice given to you by your health care provider. Make sure you discuss any questions you have with your health care provider. Document Revised: 08/18/2018 Document Reviewed: 08/26/2016 Elsevier  Patient Education  [place].

## 2019-07-06 DIAGNOSIS — R42 Dizziness and giddiness: Secondary | ICD-10-CM | POA: Insufficient documentation

## 2019-07-06 DIAGNOSIS — H9192 Unspecified hearing loss, left ear: Secondary | ICD-10-CM | POA: Insufficient documentation

## 2019-07-06 DIAGNOSIS — H9312 Tinnitus, left ear: Secondary | ICD-10-CM | POA: Insufficient documentation

## 2019-07-06 NOTE — Progress Notes (Signed)
Subjective:    Patient ID: Christine Fuentes, female    DOB: 1985-07-15, 34 y.o.   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 She used to only have vertigo once or twice a week and treated with antivert. Over the past month or so she has had vertigo multiple times a day and not responding to antivert. She has seen ENT and said she was borderline meneires but that was years ago. They told her to eat a low salt diet. She has but no real changes. She denies any dizziness when she stands or gets up but yes to if she turns her head a certain way or for no reason. Denies any vision changes. She does admit it being worse with weather. She has never had any imaging. She has noticed her hearing loss worse on left.   Pt does have a family hx of Meniere with her paternal uncle.    She reports today a lot of stress and grief. Her father recently passed away and her brother passed away at a young age. She finds herself thinking and worrying about this a lot. Her dad was the person she went to vent and now he is gone. She has always had some OCD tendoncies but now she is finding these are worse. She cleans and puts up multiple times a day. She throws away things all the time. Her kids hide stuff so we will not throw it away. She is worried this is effecting her family.   .. Active Ambulatory Problems    Diagnosis Date Noted  . Repetitive strain injury of lower back 02/03/2016  . History of vertigo 02/03/2016  . Hearing loss of left ear 07/06/2019  . Vertigo 07/06/2019  . Tinnitus of left ear 07/06/2019   Resolved Ambulatory Problems    Diagnosis Date Noted  . No Resolved Ambulatory Problems   No Additional Past Medical History      Review of Systems See HPI.     Objective:   Physical Exam Vitals reviewed.  Constitutional:      Appearance: Normal appearance.  HENT:   Head: Normocephalic.     Right Ear: Tympanic membrane, ear canal and external ear normal. There is no impacted cerumen.     Left Ear: Tympanic membrane, ear canal and external ear normal. There is no impacted cerumen.     Nose: Nose normal.     Mouth/Throat:     Mouth: Mucous membranes are moist.  Cardiovascular:     Rate and Rhythm: Normal rate and regular rhythm.     Pulses: Normal pulses.  Pulmonary:     Effort: Pulmonary effort is normal.     Breath sounds: Normal breath sounds.  Neurological:     General: No focal deficit present.     Mental Status: She is alert and oriented to person, place, and time.     Comments: Negative dix hallpike.   Psychiatric:        Mood and Affect: Mood normal.       .. Depression screen [facility] 2/9 07/03/2019 05/30/2018  Decreased Interest 0 0  Down, Depressed, Hopeless 0 0  PHQ - 2 Score 0 0  Altered sleeping 0 -  Tired, decreased energy 0 -  Change in appetite 0 -  Feeling bad or failure about yourself  0 -  Trouble concentrating 0 -  Moving slowly or fidgety/restless 0 -  Suicidal thoughts 0 -  PHQ-9 Score 0 -  Difficult doing work/chores Not difficult at all -   .. GAD 7 : Generalized Anxiety Score 07/03/2019 05/30/2018  Nervous, Anxious, on Edge 1 0  Control/stop worrying 1 0  Worry too much - different things 1 0  Trouble relaxing 1 0  Restless 1 0  Easily annoyed or irritable 1 0  Afraid - awful might happen 1 0  Total GAD 7 Score 7 0  Anxiety Difficulty Somewhat difficult -        Assessment & Plan:  Marland KitchenMarland KitchenLatise was seen today for dizziness.  Diagnoses and all orders for this visit:  Vertigo -     triamterene-hydrochlorothiazide (MAXZIDE-25) 37.5-25 MG tablet; Take 1 tablet by mouth daily.  Medication management -     BASIC METABOLIC PANEL WITH GFR  Grief -     Ambulatory referral to Psychology  Anxiety -     sertraline (ZOLOFT) 25 MG tablet; Take 1 tablet (25 mg total) by mouth daily. -     Ambulatory referral to  Psychology  Obsessive-compulsive behavior -     sertraline (ZOLOFT) 25 MG tablet; Take 1 tablet (25 mg total) by mouth daily. -     Ambulatory referral to Psychology  Hearing loss of left ear, unspecified hearing loss type -     triamterene-hydrochlorothiazide (MAXZIDE-25) 37.5-25 MG tablet; Take 1 tablet by mouth daily.  Tinnitus of left ear -     triamterene-hydrochlorothiazide (MAXZIDE-25) 37.5-25 MG tablet; Take 1 tablet by mouth daily. -     fluticasone (FLONASE) 50 MCG/ACT nasal spray; Place 2 sprays into both nostrils daily.  no orthostatic hypotension/negative dix hallpike  Concerned there has never been MRI. Will get to rule out acoustic neuroma vs meneires. If negative suggest another referral to ENT. Start maxide for possible meneires and see if improves symptoms. Start flonase for tinnitus to see if helps at all.  Follow up with PCP in next month.   Pt worried about medication side effects. After starting maxide check BMP in 2 weeks.   Discussed grief. Pt needs to go to counseling to work through this. Referral placed. Could be causing OCD. Consider starting zoloft. Gave HO. Discussed ways to control. START exercising regularly.   Spent 42 minutes with patient care.

## 2019-07-10 ENCOUNTER — Other Ambulatory Visit: Payer: Self-pay | Admitting: Physician Assistant

## 2019-07-10 MED ORDER — DIAZEPAM 5 MG PO TABS: ORAL_TABLET | ORAL | 0 refills | Status: DC

## 2019-07-10 NOTE — Progress Notes (Signed)
Sent valium for pre-MRI treatment.

## 2019-07-16 ENCOUNTER — Other Ambulatory Visit: Payer: Self-pay

## 2019-07-16 ENCOUNTER — Ambulatory Visit (INDEPENDENT_AMBULATORY_CARE_PROVIDER_SITE_OTHER): Payer: 59

## 2019-07-16 DIAGNOSIS — H9072 Mixed conductive and sensorineural hearing loss, unilateral, left ear, with unrestricted hearing on the contralateral side: Secondary | ICD-10-CM | POA: Diagnosis not present

## 2019-07-16 LAB — BASIC METABOLIC PANEL WITH GFR
BUN: 17 mg/dL (ref 7–25)
CO2: 30 mmol/L (ref 20–32)
Calcium: 10 mg/dL (ref 8.6–10.2)
Chloride: 102 mmol/L (ref 98–110)
Creat: 0.84 mg/dL (ref 0.50–1.10)
GFR, Est African American: 107 mL/min/{1.73_m2} (ref >=60)
GFR, Est Non African American: 93 mL/min/{1.73_m2} (ref >=60)
Glucose, Bld: 74 mg/dL (ref 65–139)
Potassium: 3.5 mmol/L (ref 3.5–5.3)
Sodium: 141 mmol/L (ref 135–146)

## 2019-07-16 MED ORDER — GADOBUTROL 1 MMOL/ML IV SOLN
6.5000 mL | Freq: Once | INTRAVENOUS | Status: AC | PRN
  Administered 2019-07-16: 6.5 mL via INTRAVENOUS

## 2019-07-16 NOTE — Progress Notes (Signed)
Christine Fuentes,   GREAT news. MRI of brain is normal no masses or lesions.   -Luvenia Starch

## 2019-07-16 NOTE — Progress Notes (Signed)
Labs look perfect.  How are you doing on diuretic?

## 2019-07-24 ENCOUNTER — Other Ambulatory Visit: Payer: Self-pay | Admitting: Osteopathic Medicine

## 2019-07-25 ENCOUNTER — Other Ambulatory Visit: Payer: Self-pay | Admitting: Physician Assistant

## 2019-07-25 DIAGNOSIS — R42 Dizziness and giddiness: Secondary | ICD-10-CM

## 2019-07-25 DIAGNOSIS — H9192 Unspecified hearing loss, left ear: Secondary | ICD-10-CM

## 2019-07-25 DIAGNOSIS — H9312 Tinnitus, left ear: Secondary | ICD-10-CM

## 2019-07-30 ENCOUNTER — Encounter: Payer: Self-pay | Admitting: Medical-Surgical

## 2019-08-21 ENCOUNTER — Ambulatory Visit: Payer: 59 | Admitting: Professional

## 2019-08-28 ENCOUNTER — Encounter: Payer: Self-pay | Admitting: Osteopathic Medicine

## 2019-08-30 ENCOUNTER — Other Ambulatory Visit: Payer: Self-pay | Admitting: Physician Assistant

## 2019-08-30 DIAGNOSIS — H9192 Unspecified hearing loss, left ear: Secondary | ICD-10-CM

## 2019-08-30 DIAGNOSIS — R42 Dizziness and giddiness: Secondary | ICD-10-CM

## 2019-08-30 DIAGNOSIS — H9312 Tinnitus, left ear: Secondary | ICD-10-CM

## 2019-09-12 ENCOUNTER — Other Ambulatory Visit: Payer: Self-pay | Admitting: Osteopathic Medicine

## 2019-10-19 ENCOUNTER — Other Ambulatory Visit: Payer: Self-pay | Admitting: Medical-Surgical

## 2019-11-08 ENCOUNTER — Other Ambulatory Visit: Payer: Self-pay | Admitting: Osteopathic Medicine

## 2019-11-08 DIAGNOSIS — R42 Dizziness and giddiness: Secondary | ICD-10-CM

## 2019-11-08 DIAGNOSIS — H9312 Tinnitus, left ear: Secondary | ICD-10-CM

## 2019-11-08 DIAGNOSIS — H9192 Unspecified hearing loss, left ear: Secondary | ICD-10-CM

## 2019-11-22 ENCOUNTER — Other Ambulatory Visit: Payer: Self-pay | Admitting: Osteopathic Medicine

## 2019-11-22 DIAGNOSIS — H9192 Unspecified hearing loss, left ear: Secondary | ICD-10-CM

## 2019-11-22 DIAGNOSIS — R42 Dizziness and giddiness: Secondary | ICD-10-CM

## 2019-11-22 DIAGNOSIS — H9312 Tinnitus, left ear: Secondary | ICD-10-CM

## 2019-11-26 ENCOUNTER — Other Ambulatory Visit: Payer: Self-pay | Admitting: Medical-Surgical

## 2019-11-27 NOTE — Telephone Encounter (Signed)
Routing to PCP

## 2020-01-01 ENCOUNTER — Other Ambulatory Visit: Payer: Self-pay | Admitting: Osteopathic Medicine

## 2020-01-22 ENCOUNTER — Other Ambulatory Visit: Payer: Self-pay | Admitting: Osteopathic Medicine

## 2020-02-18 ENCOUNTER — Ambulatory Visit (INDEPENDENT_AMBULATORY_CARE_PROVIDER_SITE_OTHER): Payer: 59 | Admitting: Osteopathic Medicine

## 2020-02-18 ENCOUNTER — Encounter: Payer: Self-pay | Admitting: Osteopathic Medicine

## 2020-02-18 ENCOUNTER — Encounter: Payer: Self-pay | Admitting: Gastroenterology

## 2020-02-18 VITALS — BP 112/73 | HR 66 | Temp 97.9°F | Ht 62.0 in | Wt 159.0 lb

## 2020-02-18 DIAGNOSIS — H9312 Tinnitus, left ear: Secondary | ICD-10-CM

## 2020-02-18 DIAGNOSIS — Z800 Family history of malignant neoplasm of digestive organs: Secondary | ICD-10-CM

## 2020-02-18 DIAGNOSIS — R42 Dizziness and giddiness: Secondary | ICD-10-CM | POA: Diagnosis not present

## 2020-02-18 DIAGNOSIS — H9192 Unspecified hearing loss, left ear: Secondary | ICD-10-CM | POA: Diagnosis not present

## 2020-02-18 DIAGNOSIS — Z0000 Encounter for general adult medical examination without abnormal findings: Secondary | ICD-10-CM

## 2020-02-18 MED ORDER — CETIRIZINE HCL 10 MG PO TABS: 10.0000 mg | ORAL_TABLET | Freq: Every day | ORAL | 3 refills | Status: AC

## 2020-02-18 MED ORDER — TRIAMTERENE-HCTZ 37.5-25 MG PO TABS
1.0000 | ORAL_TABLET | Freq: Every day | ORAL | 3 refills | Status: DC
Start: 2020-02-18 — End: 2021-07-08

## 2020-02-18 MED ORDER — MECLIZINE HCL 25 MG PO TABS
25.0000 mg | ORAL_TABLET | Freq: Three times a day (TID) | ORAL | 3 refills | Status: DC | PRN
Start: 2020-02-18 — End: 2021-03-11

## 2020-02-18 NOTE — Progress Notes (Signed)
Christine Fuentes is a 34 y.o. female who presents to  [facility]  today, 02/18/20, seeking care for the following:  . Annual o Cervical cancer screening - sees OBGYN o Colon cancer screening - saw GI 06/17/19 for (+)FH CA, referral to genetic counselor, set up for colonoscopy, looks like nothing done but this was just prior to [place] with other pertinent findings:  The primary encounter diagnosis was Annual physical exam. Diagnoses of Vertigo, Hearing loss of left ear, unspecified hearing loss type, Tinnitus of left ear, and Family history of colon cancer were also pertinent to this visit.   No results found for this or any previous visit (from the past 24 hour(s)).     Patient Instructions  General Preventive Care  Most recent routine screening labs: ordered today.   Blood pressure goal 130/80 or less.   Tobacco: don't!  Alcohol: responsible moderation is ok for most adults - if you have concerns about your alcohol intake, please talk to me!   Exercise: as tolerated to reduce risk of cardiovascular disease and diabetes. Strength training will also prevent osteoporosis.   Mental health: if need for mental health care (medicines, counseling, other), or concerns about moods, please let me know!   Sexual / Reproductive health: if need for STD testing, or if concerns with libido/pain problems, or you need to discuss family planning, please let me or OBGYN know!   Advanced Directive: Living Will and/or Healthcare Power of Attorney recommended for all adults, regardless of age or health.  Vaccines  Flu vaccine: for almost everyone, every fall.   Shingles vaccine: after age 59.   Pneumonia vaccines: after age 9, or sooner if certain medical conditions.  Tetanus booster: every 10 years / 3rd trimester of pregnancy  HPV vaccine: Gardasil up to age 70 to prevent HPV-associated diseases,  including certain cancers.   COVID vaccine: THANKS for getting your vaccine! :)  /Cancer screenings   Colon cancer screening: for everyone age 76-75.   Breast cancer screening: mammogram at age 26   Cervical cancer screening: Pap every 1 to 5 years depending on age and other risk factors. Can usually stop at age 59 or w/ hysterectomy.   Lung cancer screening: not needed for non-smokers  Infection screenings  . HIV: recommended screening at least once age 76-65, more often as needed. . Gonorrhea/Chlamydia: screening as needed . Hepatitis C: recommended once for everyone age 19-75 . TB: certain at-risk populations, or depending on work requirements and/or travel history Other . Bone Density Test: recommended for women at age 75   Orders Placed This Encounter  Procedures  . CBC  . COMPLETE METABOLIC PANEL WITH GFR  . Lipid panel  . Ambulatory referral to Genetics    Meds ordered this encounter  Medications  . triamterene-hydrochlorothiazide (MAXZIDE-25) 37.5-25 MG tablet    Sig: Take 1 tablet by mouth daily.    Dispense:  90 tablet    Refill:  3  . cetirizine (ZYRTEC) 10 MG tablet    Sig: Take 1 tablet (10 mg total) by mouth daily.    Dispense:  90 tablet    Refill:  3  . meclizine (ANTIVERT) 25 MG tablet    Sig: Take 1 tablet (25 mg total) by mouth 3 (three) times daily as needed.    Dispense:  90 tablet    Refill:  3  Follow-up instructions: Return in about 1 year (around 02/17/2021) for [place] (call week prior to visit for lab orders).                                         BP 112/73 (BP Location: Left Arm, Patient Position: Sitting)   Pulse 66   Temp 97.9 F (36.6 C)   Ht 5\' 2"  (1.575 m)   Wt 159 lb (72.1 kg)   SpO2 100%   BMI 29.08 kg/m   Current Meds  Medication Sig  . cetirizine (ZYRTEC) 10 MG tablet Take 1 tablet (10 mg total) by mouth daily.  . diazepam (VALIUM) 5 MG tablet Take 1 tab PO 2 hours before  procedure or imaging.  . fluticasone (FLONASE) 50 MCG/ACT nasal spray Place 2 sprays into both nostrils daily.  Marland Kitchen levonorgestrel (MIRENA) 20 MCG/24HR IUD 1 each by Intrauterine route once. Placed 10/2016  . meclizine (ANTIVERT) 25 MG tablet Take 1 tablet (25 mg total) by mouth 3 (three) times daily as needed.  . triamterene-hydrochlorothiazide (MAXZIDE-25) 37.5-25 MG tablet Take 1 tablet by mouth daily.  . [DISCONTINUED] cetirizine (ZYRTEC) 10 MG tablet Take 10 mg by mouth daily.  . [DISCONTINUED] meclizine (ANTIVERT) 25 MG tablet TAKE 1 TABLET BY MOUTH THREE TIMES A DAY AS NEEDED  . [DISCONTINUED] triamterene-hydrochlorothiazide (MAXZIDE-25) 37.5-25 MG tablet TAKE 1 TABLET BY MOUTH EVERY DAY    No results found for this or any previous visit (from the past 72 hour(s)).  No results found.     All questions at time of visit were answered - patient instructed to contact office with any additional concerns or updates.  ER/RTC precautions were reviewed with the patient as applicable.   Please note: voice recognition software was used to produce this document, and typos may escape review. Please contact Dr. Sheppard Coil for any needed clarifications.

## 2020-02-18 NOTE — Patient Instructions (Addendum)
General Preventive Care  Most recent routine screening labs: ordered today.   Blood pressure goal 130/80 or less.   Tobacco: don't!  Alcohol: responsible moderation is ok for most adults - if you have concerns about your alcohol intake, please talk to me!   Exercise: as tolerated to reduce risk of cardiovascular disease and diabetes. Strength training will also prevent osteoporosis.   Mental health: if need for mental health care (medicines, counseling, other), or concerns about moods, please let me know!   Sexual / Reproductive health: if need for STD testing, or if concerns with libido/pain problems, or you need to discuss family planning, please let me or OBGYN know!   Advanced Directive: Living Will and/or Healthcare Power of Attorney recommended for all adults, regardless of age or health.  Vaccines  Flu vaccine: for almost everyone, every fall.   Shingles vaccine: after age 39.   Pneumonia vaccines: after age 41, or sooner if certain medical conditions.  Tetanus booster: every 10 years / 3rd trimester of pregnancy  HPV vaccine: Gardasil up to age 35 to prevent HPV-associated diseases, including certain cancers.   COVID vaccine: THANKS for getting your vaccine! :)  /Cancer screenings   Colon cancer screening: for everyone age 90-75.   Breast cancer screening: mammogram at age 75   Cervical cancer screening: Pap every 1 to 5 years depending on age and other risk factors. Can usually stop at age 70 or w/ hysterectomy.   Lung cancer screening: not needed for non-smokers  Infection screenings  . HIV: recommended screening at least once age 40-65, more often as needed. . Gonorrhea/Chlamydia: screening as needed . Hepatitis C: recommended once for everyone age 50-75 . TB: certain at-risk populations, or depending on work requirements and/or travel history Other . Bone Density Test: recommended for women at age 31

## 2020-02-26 LAB — CBC
HCT: 42.7 % (ref 35.0–45.0)
Hemoglobin: 14.4 g/dL (ref 11.7–15.5)
MCH: 30 pg (ref 27.0–33.0)
MCHC: 33.7 g/dL (ref 32.0–36.0)
MCV: 89 fL (ref 80.0–100.0)
MPV: 11.2 fL (ref 7.5–12.5)
Platelets: 269 10*3/uL (ref 140–400)
RBC: 4.8 10*6/uL (ref 3.80–5.10)
RDW: 12.1 % (ref 11.0–15.0)
WBC: 4.9 10*3/uL (ref 3.8–10.8)

## 2020-02-26 LAB — COMPLETE METABOLIC PANEL WITH GFR
AG Ratio: 2.1 (calc) (ref 1.0–2.5)
ALT: 9 U/L (ref 6–29)
AST: 13 U/L (ref 10–30)
Albumin: 4.8 g/dL (ref 3.6–5.1)
Alkaline phosphatase (APISO): 45 U/L (ref 31–125)
BUN: 10 mg/dL (ref 7–25)
CO2: 25 mmol/L (ref 20–32)
Calcium: 9.9 mg/dL (ref 8.6–10.2)
Chloride: 105 mmol/L (ref 98–110)
Creat: 0.82 mg/dL (ref 0.50–1.10)
GFR, Est African American: 110 mL/min/{1.73_m2} (ref >=60)
GFR, Est Non African American: 95 mL/min/{1.73_m2} (ref >=60)
Globulin: 2.3 g/dL (calc) (ref 1.9–3.7)
Glucose, Bld: 76 mg/dL (ref 65–99)
Potassium: 4.1 mmol/L (ref 3.5–5.3)
Sodium: 140 mmol/L (ref 135–146)
Total Bilirubin: 0.7 mg/dL (ref 0.2–1.2)
Total Protein: 7.1 g/dL (ref 6.1–8.1)

## 2020-02-26 LAB — LIPID PANEL
Cholesterol: 157 mg/dL (ref <200)
HDL: 67 mg/dL (ref >=50)
LDL Cholesterol (Calc): 76 mg/dL (calc)
Non-HDL Cholesterol (Calc): 90 mg/dL (calc) (ref <130)
Total CHOL/HDL Ratio: 2.3 (calc) (ref <5.0)
Triglycerides: 55 mg/dL (ref <150)

## 2020-03-12 ENCOUNTER — Other Ambulatory Visit: Payer: Self-pay

## 2020-03-12 ENCOUNTER — Ambulatory Visit (INDEPENDENT_AMBULATORY_CARE_PROVIDER_SITE_OTHER): Payer: 59 | Admitting: Osteopathic Medicine

## 2020-03-12 DIAGNOSIS — Z23 Encounter for immunization: Secondary | ICD-10-CM

## 2020-04-01 ENCOUNTER — Other Ambulatory Visit: Payer: Self-pay

## 2020-04-01 ENCOUNTER — Ambulatory Visit (AMBULATORY_SURGERY_CENTER): Payer: Self-pay | Admitting: *Deleted

## 2020-04-01 VITALS — Ht 62.0 in | Wt 160.0 lb

## 2020-04-01 DIAGNOSIS — Z800 Family history of malignant neoplasm of digestive organs: Secondary | ICD-10-CM

## 2020-04-01 MED ORDER — CLENPIQ 10-3.5-12 MG-GM -GM/160ML PO SOLN: 1.0000 | ORAL | 0 refills | Status: DC

## 2020-04-01 NOTE — Progress Notes (Signed)
Fully vax'd   No egg or soy allergy known to patient  No issues with past sedation with any surgeries or procedures no intubation problems in the past  No FH of Malignant Hyperthermia No diet pills per patient No home 02 use per patient  No blood thinners per patient  Pt denies issues with constipation  No A fib or A flutter  EMMI video to pt or via [place] 19 guidelines implemented in PV today with Pt and RN   [facility] Rx given to pt in [facility] today , Code to Pharmacy   Due to the COVID-19 pandemic we are asking patients to follow these guidelines. Please only bring one care partner. Please be aware that your care partner may wait in the car in the parking lot or if they feel like they will be too hot to wait in the car, they may wait in the lobby on the 4th floor. All care partners are required to wear a mask the entire time (we do not have any that we can provide them), they need to practice social distancing, and we will do a Covid check for all patient's and care partners when you arrive. Also we will check their temperature and your temperature. If the care partner waits in their car they need to stay in the parking lot the entire time and we will call them on their cell phone when the patient is ready for discharge so they can bring the car to the front of the building. Also all patient's will need to wear a mask into building.

## 2020-04-02 ENCOUNTER — Encounter: Payer: Self-pay | Admitting: Gastroenterology

## 2020-04-15 ENCOUNTER — Other Ambulatory Visit: Payer: Self-pay

## 2020-04-15 ENCOUNTER — Encounter: Payer: Self-pay | Admitting: Gastroenterology

## 2020-04-15 ENCOUNTER — Ambulatory Visit (AMBULATORY_SURGERY_CENTER): Payer: 59 | Admitting: Gastroenterology

## 2020-04-15 VITALS — BP 102/60 | HR 73 | Temp 97.7°F | Resp 16 | Ht 62.0 in | Wt 160.0 lb

## 2020-04-15 DIAGNOSIS — D123 Benign neoplasm of transverse colon: Secondary | ICD-10-CM

## 2020-04-15 DIAGNOSIS — Z800 Family history of malignant neoplasm of digestive organs: Secondary | ICD-10-CM | POA: Diagnosis present

## 2020-04-15 DIAGNOSIS — K635 Polyp of colon: Secondary | ICD-10-CM | POA: Diagnosis not present

## 2020-04-15 DIAGNOSIS — D120 Benign neoplasm of cecum: Secondary | ICD-10-CM

## 2020-04-15 DIAGNOSIS — Z1211 Encounter for screening for malignant neoplasm of colon: Secondary | ICD-10-CM

## 2020-04-15 MED ORDER — SODIUM CHLORIDE 0.9 % IV SOLN: 500.0000 mL | Freq: Once | INTRAVENOUS | Status: DC

## 2020-04-15 NOTE — Op Note (Signed)
[facility] Patient Name: Christine Fuentes Procedure Date: 04/15/2020 9:58 AM MRN: [id number] Endoscopist: Gerrit Heck , MD Age: 34 Referring MD:  Date of Birth: 06-Apr-1986 Gender: Female Account #: [id number] Procedure:                Colonoscopy Indications:              Screening in patient at increased risk: Colorectal                            cancer in brother before age 12                           Family history notable for brother diagnosed at age                            37 with metastatic appendical CA- Signet Ring                            Mucinous Adenocarcinoma. She is otherwise without                            active GI symptoms and presents today for initial                            increased risk colon cancer screening. Medicines:                Monitored Anesthesia Care Procedure:                Pre-Anesthesia Assessment:                           - Prior to the procedure, a History and Physical                            was performed, and patient medications and                            allergies were reviewed. The patient's tolerance of                            previous anesthesia was also reviewed. The risks                            and benefits of the procedure and the sedation                            options and risks were discussed with the patient.                            All questions were answered, and informed consent                            was obtained. Prior Anticoagulants: The patient has  taken no previous anticoagulant or antiplatelet                            agents. ASA Grade Assessment: II - A patient with                            mild systemic disease. After reviewing the risks                            and benefits, the patient was deemed in                            satisfactory condition to undergo the procedure.                           After obtaining informed consent, the  colonoscope                            was passed under direct vision. Throughout the                            procedure, the patient's blood pressure, pulse, and                            oxygen saturations were monitored continuously. The                            Colonoscope was introduced through the anus and                            advanced to the the terminal ileum. The colonoscopy                            was performed without difficulty. The patient                            tolerated the procedure well. The quality of the                            bowel preparation was excellent. The terminal                            ileum, ileocecal valve, appendiceal orifice, and                            rectum were photographed. Scope In: 10:06:53 AM Scope Out: 10:20:39 AM Scope Withdrawal Time: 0 hours 9 minutes 14 seconds  Total Procedure Duration: 0 hours 13 minutes 46 seconds  Findings:                 The perianal and digital rectal examinations were                            normal.  Two sessile polyps were found in the proximal                            transverse colon and cecum. The polyps were 4 to 6                            mm in size. These polyps were removed with a cold                            snare. Resection and retrieval were complete.                            Estimated blood loss was minimal.                           The exam was otherwise normal throughout the                            remainder of the colon. The cecum was fully                            insufflated and no extraluminal compression or                            tenting noted. The air was then removed.                           The retroflexed view of the distal rectum and anal                            verge was normal and showed no anal or rectal                            abnormalities.                           The terminal ileum appeared  normal. Complications:            No immediate complications. Estimated Blood Loss:     Estimated blood loss was minimal. Impression:               - Two 4 to 6 mm polyps in the proximal transverse                            colon and in the cecum, removed with a cold snare.                            Resected and retrieved.                           - The distal rectum and anal verge are normal on                            retroflexion view.                           -  The examined portion of the ileum was normal. Recommendation:           - Patient has a contact number available for                            emergencies. The signs and symptoms of potential                            delayed complications were discussed with the                            patient. Return to normal activities tomorrow.                            Written discharge instructions were provided to the                            patient.                           - Resume previous diet.                           - Continue present medications.                           - Await pathology results.                           - Based on current societal recommendations                            regarding family history of Signet Ring                            Adenocarcinoma, recommend repeat colonoscopy in 3                            years for continued surveillance.                           - Return to GI office at appointment to be                            scheduled. Will plan to discuss the role and timing                            of possible CT abdomen/pelvis along with potential                            referral to the surgical clinic to at least discuss                            the utility of prophylactic appendectomy. Gerrit Heck, MD 04/15/2020 10:33:03 AM

## 2020-04-15 NOTE — Progress Notes (Signed)
pt tolerated well. VSS. awake and to recovery. Report given to RN.  

## 2020-04-15 NOTE — Progress Notes (Signed)
Called to room to assist during endoscopic procedure.  Patient ID and intended procedure confirmed with present staff. Received instructions for my participation in the procedure from the performing physician.  

## 2020-04-15 NOTE — Patient Instructions (Signed)
Handout :  Polyps Resume previous diet Continue current medications Await pathology results Repeat colonoscopy in 3 years  YOU HAD AN ENDOSCOPIC PROCEDURE TODAY AT [place]:   Refer to the procedure report that was given to you for any specific questions about what was found during the examination.  If the procedure report does not answer your questions, please call your gastroenterologist to clarify.  If you requested that your care partner not be given the details of your procedure findings, then the procedure report has been included in a sealed envelope for you to review at your convenience later.  YOU SHOULD EXPECT: Some feelings of bloating in the abdomen. Passage of more gas than usual.  Walking can help get rid of the air that was put into your GI tract during the procedure and reduce the bloating. If you had a lower endoscopy (such as a colonoscopy or flexible sigmoidoscopy) you may notice spotting of blood in your stool or on the toilet paper. If you underwent a bowel prep for your procedure, you may not have a normal bowel movement for a few days.  Please Note:  You might notice some irritation and congestion in your nose or some drainage.  This is from the oxygen used during your procedure.  There is no need for concern and it should clear up in a day or so.  SYMPTOMS TO REPORT IMMEDIATELY:   Following lower endoscopy (colonoscopy or flexible sigmoidoscopy):  Excessive amounts of blood in the stool  Significant tenderness or worsening of abdominal pains  Swelling of the abdomen that is new, acute  Fever of 100F or higher  For urgent or emergent issues, a gastroenterologist can be reached at any hour by calling [phone]. Do not use MyChart messaging for urgent concerns.   DIET:  We do recommend a small meal at first, but then you may proceed to your regular diet.  Drink plenty of fluids but you should avoid alcoholic beverages for 24 hours.  ACTIVITY:   You should plan to take it easy for the rest of today and you should NOT DRIVE or use heavy machinery until tomorrow (because of the sedation medicines used during the test).    FOLLOW UP: Our staff will call the number listed on your records 48-72 hours following your procedure to check on you and address any questions or concerns that you may have regarding the information given to you following your procedure. If we do not reach you, we will leave a message.  We will attempt to reach you two times.  During this call, we will ask if you have developed any symptoms of COVID 19. If you develop any symptoms (ie: fever, flu-like symptoms, shortness of breath, cough etc.) before then, please call [phone].  If you test positive for Covid 19 in the 2 weeks post procedure, please call and report this information to [place].   If any biopsies were taken you will be contacted by phone or by letter within the next 1-3 weeks.  Please call us at [phone] if you have not heard about the biopsies in 3 weeks.   SIGNATURES/CONFIDENTIALITY: You and/or your care partner have signed paperwork which will be entered into your electronic medical record.  These signatures attest to the fact that that the information above on your After Visit Summary has been reviewed and is understood.  Full responsibility of the confidentiality of this discharge information lies with you and/or your care-partner.

## 2020-04-15 NOTE — Progress Notes (Signed)
Pt's states no medical or surgical changes since previsit or office visit.  Vs in adm by Loel Ro

## 2020-04-16 ENCOUNTER — Telehealth: Payer: Self-pay | Admitting: General Surgery

## 2020-04-16 DIAGNOSIS — Z800 Family history of malignant neoplasm of digestive organs: Secondary | ICD-10-CM

## 2020-04-16 NOTE — Telephone Encounter (Signed)
-----   Message from [place], DO sent at 04/16/2020 10:26 AM EST ----- Can you please reach out to this patient to let her know I discussed her case with the [facility] and they agree that a formal evaluation in their clinic with possible genetics labs would be beneficial. With regards to cost and insurance coverage, they should be able to guide her a little better when she meets with them on what this could look like financially and can then decide if she would like to move forward with formal genetic testing. But initially would be just a clinic appointment and that does not commit her to any further testing, just the conversation.  I also reached out to the surgeon she and I discussed and will update her on that conversation when I hear back.   Thanks.

## 2020-04-16 NOTE — Telephone Encounter (Signed)
Patient verbalized understanding, will schedule for a consult. Then a cost of testing will need to be given in order for the patient to move forward.  Referral sent for genetic counseling

## 2020-04-17 ENCOUNTER — Telehealth: Payer: Self-pay

## 2020-04-17 NOTE — Telephone Encounter (Signed)
Could not leave message.

## 2020-04-21 ENCOUNTER — Telehealth: Payer: Self-pay | Admitting: Genetic Counselor

## 2020-04-21 NOTE — Telephone Encounter (Signed)
Received a genetic counseling referral from Dr. Sheppard Coil for fhx of GI malignancy. Ms. Eads returned my call and has been scheduled to see Santiago Glad on 1/5 at [place]. Letter mailed.

## 2020-04-22 ENCOUNTER — Encounter: Payer: Self-pay | Admitting: Gastroenterology

## 2020-05-13 ENCOUNTER — Encounter: Payer: 59 | Admitting: Genetic Counselor

## 2020-05-13 ENCOUNTER — Telehealth: Payer: Self-pay | Admitting: Genetic Counselor

## 2020-05-13 NOTE — Telephone Encounter (Signed)
Left message to verify patient visit for pre reg

## 2020-05-14 ENCOUNTER — Inpatient Hospital Stay: Payer: 59

## 2020-05-14 ENCOUNTER — Inpatient Hospital Stay: Payer: 59 | Attending: Genetic Counselor | Admitting: Genetic Counselor

## 2020-05-14 ENCOUNTER — Encounter: Payer: Self-pay | Admitting: Genetic Counselor

## 2020-05-14 DIAGNOSIS — Z803 Family history of malignant neoplasm of breast: Secondary | ICD-10-CM | POA: Insufficient documentation

## 2020-05-14 DIAGNOSIS — Z8041 Family history of malignant neoplasm of ovary: Secondary | ICD-10-CM | POA: Diagnosis not present

## 2020-05-14 NOTE — Progress Notes (Signed)
REFERRING PROVIDER: Emeterio Reeve, [place] [place],  [place] [address]  PRIMARY PROVIDER:  Emeterio Reeve, DO  PRIMARY REASON FOR VISIT:  1. Family history of breast cancer   2. Family history of ovarian cancer      HISTORY OF PRESENT ILLNESS:  I connected with  Christine Fuentes on 05/14/2020 at 10 AM EDT by MyChart video conference and verified that I am speaking with the correct person using two identifiers.   Patient location: Home Provider location: [facility]   Christine Fuentes, a 35 y.o. female, was seen for a [place] cancer genetics consultation at the request of Dr. Sheppard Coil due to a family history of cancer.  Christine Fuentes presents to clinic today to discuss the possibility of a hereditary predisposition to cancer, genetic testing, and to further clarify her future cancer risks, as well as potential cancer risks for family members.   Christine Fuentes is a 35 y.o. female with no personal history of cancer.    CANCER HISTORY:  Oncology History   No history exists.     RISK FACTORS:  Menarche was at age 56.  First live birth at age 80.  OCP use for approximately 8 years.  Ovaries intact: yes.  Hysterectomy: no.  Menopausal status: premenopausal.  HRT use: 0 years. Colonoscopy: yes; 2 colon polyps. Mammogram within the last year: no. Number of breast biopsies: 0. Up to date with pelvic exams: yes. Any excessive radiation exposure in the past: no  Past Medical History:  Diagnosis Date   Allergy    Family history of breast cancer    Family history of ovarian cancer    GERD (gastroesophageal reflux disease)    occ _ TUMS PRN    Neuromuscular disorder ([place])    pinched nerve but received injections and resolved     Past Surgical History:  Procedure Laterality Date   CESAREAN SECTION     x2   injection in back     WISDOM TOOTH EXTRACTION      Social History   Socioeconomic History   Marital status: Married    Spouse name: Not on file    Number of children: 2   Years of education: Not on file   Highest education level: Not on file  Occupational History   Not on file  Tobacco Use   Smoking status: Never Smoker   Smokeless tobacco: Never Used  Vaping Use   Vaping Use: Never used  Substance and Sexual Activity   Alcohol use: Not Currently   Drug use: Never   Sexual activity: Yes    Partners: Male    Birth control/protection: None  Other Topics Concern   Not on file  Social History Narrative   Not on file   Social Determinants of Health   Financial Resource Strain: Not on file  Food Insecurity: Not on file  Transportation Needs: Not on file  Physical Activity: Not on file  Stress: Not on file  Social Connections: Not on file     FAMILY HISTORY:  We obtained a detailed, 4-generation family history.  Significant diagnoses are listed below: Family History  Problem Relation Age of Onset   Hypertension Mother    Colon polyps Mother    Hypertension Father    Colon polyps Father    Cancer Brother 70       appendix    Colon cancer Brother    Diabetes Maternal Grandfather    Breast cancer Maternal Grandmother  dx in her 57s   Ovarian cancer Maternal Grandmother        dx in her 46s   Lupus Paternal Grandmother    Dementia Paternal Grandfather    Esophageal cancer Neg Hx    Rectal cancer Neg Hx    Stomach cancer Neg Hx     The patient has two daughters who are cancer free.  She has a brother who had appendiceal cancer at 71 and died at 65.  Her mother is living and her father is deceased.  The patient's mother is alive at 21.  She has two brothers who are cancer free.  The maternal grandparents are deceased.  The grandmother had breast and ovarian cancer in her 53's.  The patient's father died suddenly at age 34.  He has one brother who is cancer free.  His parents died of non-cancer related issues.  Christine Fuentes is unaware of previous family history of genetic testing for  hereditary cancer risks. Patient's maternal ancestors are of Caucasian descent, and paternal ancestors are of Caucasian descent. There is no reported Ashkenazi Jewish ancestry. There is no known consanguinity.  GENETIC COUNSELING ASSESSMENT: Christine Fuentes is a 35 y.o. female with a family history of cancer which is somewhat suggestive of a hereditary cancer syndrome and predisposition to cancer given the young age of onset of her brother and the combination of breast and ovarian cancer. We, therefore, discussed and recommended the following at today's visit.   DISCUSSION: We discussed that up to 20% of ovarian cancer is hereditary, with most cases associated with BRCA mutations.  There are other genes that can be associated with hereditary ovarian cancer syndromes.  These include BRIP1, RAD51C, RAD51D and Lynch syndrome genes.  Her brother had appendiceal cancer, which has been seen with BRCA1 mutations, but is also considered a GI cancer.  We discussed that testing is beneficial for several reasons including knowing how to follow individuals and understand if other family members could be at risk for cancer and allow them to undergo genetic testing.   We reviewed the characteristics, features and inheritance patterns of hereditary cancer syndromes. We also discussed genetic testing, including the appropriate family members to test, the process of testing, insurance coverage and turn-around-time for results. We discussed the implications of a negative, positive, carrier and/or variant of uncertain significant result.   We discussed that some people do not want to undergo genetic testing due to fear of genetic discrimination.  A federal law called the Genetic Information Non-Discrimination Act (GINA) of 2008 helps protect individuals against genetic discrimination based on their genetic test results.  It impacts both health insurance and employment.  With health insurance, it protects against increased premiums,  being kicked off insurance or being forced to take a test in order to be insured.  For employment it protects against hiring, firing and promoting decisions based on genetic test results.  Health status due to a cancer diagnosis is not protected under GINA.   Based on Christine Fuentes family history of cancer, she meets medical criteria for genetic testing. Despite that she meets criteria, she may still have an out of pocket cost. We discussed that if her out of pocket cost for testing is over $100, the laboratory will call and confirm whether she wants to proceed with testing.  If the out of pocket cost of testing is less than $100 she will be billed by the genetic testing laboratory.   Based on Christine Fuentes family history, we recommended  her mother have genetic counseling and testing. Christine Fuentes will let us know if we can be of any assistance in coordinating genetic counseling and/or testing for this family member.   PLAN: Despite our recommendation, Christine Fuentes did not wish to pursue genetic testing at today's visit. We understand this decision and remain available to coordinate genetic testing at any time in the future. We, therefore, recommend Christine Fuentes continue to follow the cancer screening guidelines given by her primary healthcare provider.  Lastly, we encouraged Christine Fuentes to remain in contact with cancer genetics annually so that we can continuously update the family history and inform her of any changes in cancer genetics and testing that may be of benefit for this family.   Christine Fuentes were answered to her satisfaction today. Our contact information was provided should additional Fuentes or concerns arise. Thank you for the referral and allowing [place] to share in the care of your patient.   Khayla Koppenhaver P. Florene Glen, [place], [facility] Licensed, Insurance risk surveyor [email or web address] phone: [phone]  The patient was seen for a total of 47 minutes in face-to-face genetic counseling.  This  patient was discussed with Drs. Magrinat, Lindi Adie and/or Burr Medico who agrees with the above.    _______________________________________________________________________ For Office Staff:  Number of people involved in session: 1 Was an Intern/ student involved with case: no

## 2021-01-05 ENCOUNTER — Encounter: Payer: Self-pay | Admitting: Family Medicine

## 2021-01-05 ENCOUNTER — Other Ambulatory Visit: Payer: Self-pay

## 2021-01-05 ENCOUNTER — Ambulatory Visit (INDEPENDENT_AMBULATORY_CARE_PROVIDER_SITE_OTHER): Payer: Managed Care, Other (non HMO) | Admitting: Family Medicine

## 2021-01-05 VITALS — BP 128/79 | HR 79 | Temp 97.9°F | Resp 16

## 2021-01-05 DIAGNOSIS — H9312 Tinnitus, left ear: Secondary | ICD-10-CM

## 2021-01-05 DIAGNOSIS — J019 Acute sinusitis, unspecified: Secondary | ICD-10-CM

## 2021-01-05 DIAGNOSIS — J309 Allergic rhinitis, unspecified: Secondary | ICD-10-CM | POA: Diagnosis not present

## 2021-01-05 MED ORDER — PHENOL 1.4 % MT LIQD: 1.0000 | OROMUCOSAL | 1 refills | Status: DC | PRN

## 2021-01-05 MED ORDER — AMOXICILLIN-POT CLAVULANATE 875-125 MG PO TABS: 1.0000 | ORAL_TABLET | Freq: Two times a day (BID) | ORAL | 0 refills | Status: AC

## 2021-01-05 MED ORDER — FLUTICASONE PROPIONATE 50 MCG/ACT NA SUSP: 2.0000 | Freq: Every day | NASAL | 6 refills | Status: DC

## 2021-01-05 MED ORDER — AZELASTINE HCL 0.1 % NA SOLN: 2.0000 | Freq: Two times a day (BID) | NASAL | 1 refills | Status: DC

## 2021-01-05 NOTE — Progress Notes (Signed)
Acute Office Visit  Subjective:    Patient ID: Christine Fuentes, female    DOB: [birth year], 35 y.o.   MRN: [id number]  Chief Complaint  Patient presents with   URI   Sore Throat   Cough    HPI Patient is in today for URI symptoms  Entire household was sick with bad colds about 3 weeks ago and everyone is feeling better except for patient. She reports ongoing sore throat, "tickle in throat," cough (worse at night), body aches/fatigue (mostly at night), rhinorrhea, nasal congestion, intermittent sinus pressure. She denies any fevers/chills, headaches, ear pain/pressure, n/v/d, shortness of breath, chest pain. She does have seasonal allergies at baseline usually in the spring. She takes Zyrtec and has been using some OTC medications with minimal improvement.     Past Medical History:  Diagnosis Date   Allergy    Family history of breast cancer    Family history of ovarian cancer    GERD (gastroesophageal reflux disease)    occ _ TUMS PRN    Neuromuscular disorder ([place])    pinched nerve but received injections and resolved     Past Surgical History:  Procedure Laterality Date   CESAREAN SECTION     x2   injection in back     WISDOM TOOTH EXTRACTION      Family History  Problem Relation Age of Onset   Hypertension Mother    Colon polyps Mother    Hypertension Father    Colon polyps Father    Cancer Brother 59       appendix    Colon cancer Brother    Diabetes Maternal Grandfather    Breast cancer Maternal Grandmother        dx in her 13s   Ovarian cancer Maternal Grandmother        dx in her 17s   Lupus Paternal Grandmother    Dementia Paternal Grandfather    Esophageal cancer Neg Hx    Rectal cancer Neg Hx    Stomach cancer Neg Hx     Social History   Socioeconomic History   Marital status: Married    Spouse name: Not on file   Number of children: 2   Years of education: Not on file   Highest education level: Not on file  Occupational History   Not on  file  Tobacco Use   Smoking status: Never   Smokeless tobacco: Never  Vaping Use   Vaping Use: Never used  Substance and Sexual Activity   Alcohol use: Not Currently   Drug use: Never   Sexual activity: Yes    Partners: Male    Birth control/protection: None  Other Topics Concern   Not on file  Social History Narrative   Not on file   Social Determinants of Health   Financial Resource Strain: Not on file  Food Insecurity: Not on file  Transportation Needs: Not on file  Physical Activity: Not on file  Stress: Not on file  Social Connections: Not on file  Intimate Partner Violence: Not on file    Outpatient Medications Prior to Visit  Medication Sig Dispense Refill   cetirizine (ZYRTEC) 10 MG tablet Take 1 tablet (10 mg total) by mouth daily. 90 tablet 3   levonorgestrel (MIRENA) 20 MCG/24HR IUD 1 each by Intrauterine route once. Placed 10/2016     meclizine (ANTIVERT) 25 MG tablet Take 1 tablet (25 mg total) by mouth 3 (three) times daily as needed. 90 tablet 3  tretinoin (RETIN-A) 0.025 % cream Apply topically. (Patient not taking: Reported on 04/15/2020)     triamterene-hydrochlorothiazide (MAXZIDE-25) 37.5-25 MG tablet Take 1 tablet by mouth daily. (Patient not taking: Reported on 04/15/2020) 90 tablet 3   fluticasone (FLONASE) 50 MCG/ACT nasal spray Place 2 sprays into both nostrils daily. 16 g 6   No facility-administered medications prior to visit.    No Known Allergies  Review of Systems All review of systems negative except what is listed in the HPI     Objective:    Physical Exam Vitals reviewed.  Constitutional:      Appearance: Normal appearance.  HENT:     Head: Normocephalic and atraumatic.     Right Ear: A middle ear effusion is present.     Left Ear: A middle ear effusion is present.     Mouth/Throat:     Mouth: Mucous membranes are moist.     Pharynx: Oropharynx is clear. No oropharyngeal exudate or posterior oropharyngeal erythema.   Cardiovascular:     Rate and Rhythm: Normal rate and regular rhythm.     Pulses: Normal pulses.     Heart sounds: Normal heart sounds.  Pulmonary:     Effort: Pulmonary effort is normal.     Breath sounds: Normal breath sounds.  Musculoskeletal:     Cervical back: Normal range of motion and neck supple.  Lymphadenopathy:     Cervical: No cervical adenopathy.  Skin:    Findings: No rash.  Neurological:     Mental Status: She is alert and oriented to person, place, and time.  Psychiatric:        Mood and Affect: Mood normal.        Behavior: Behavior normal.        Thought Content: Thought content normal.        Judgment: Judgment normal.    BP 128/79   Pulse 79   Temp 97.9 F (36.6 C)   Resp 16   SpO2 100%  Wt Readings from Last 3 Encounters:  04/15/20 160 lb (72.6 kg)  04/01/20 160 lb (72.6 kg)  02/18/20 159 lb (72.1 kg)    Health Maintenance Due  Topic Date Due   Pneumococcal Vaccine 57-72 Years old (1 - PCV) Never done   Hepatitis C Screening  Never done   TETANUS/TDAP  Never done   PAP SMEAR-Modifier  05/10/2017   COVID-19 Vaccine (3 - Pfizer risk series) 09/26/2019   INFLUENZA VACCINE  12/08/2020    There are no preventive care reminders to display for this patient.   No results found for: TSH Lab Results  Component Value Date   WBC 4.9 02/25/2020   HGB 14.4 02/25/2020   HCT 42.7 02/25/2020   MCV 89.0 02/25/2020   PLT 269 02/25/2020   Lab Results  Component Value Date   NA 140 02/25/2020   K 4.1 02/25/2020   CO2 25 02/25/2020   GLUCOSE 76 02/25/2020   BUN 10 02/25/2020   CREATININE 0.82 02/25/2020   BILITOT 0.7 02/25/2020   AST 13 02/25/2020   ALT 9 02/25/2020   PROT 7.1 02/25/2020   CALCIUM 9.9 02/25/2020   Lab Results  Component Value Date   CHOL 157 02/25/2020   Lab Results  Component Value Date   HDL 67 02/25/2020   Lab Results  Component Value Date   LDLCALC 76 02/25/2020   Lab Results  Component Value Date   TRIG 55  02/25/2020   Lab Results  Component Value  Date   CHOLHDL 2.3 02/25/2020   No results found for: HGBA1C     Assessment & Plan:   1. Acute non-recurrent sinusitis, unspecified location 2. Allergic rhinitis, unspecified seasonality, unspecified trigger  Patient with over 3 weeks of symptoms now. Likely and allergic component. Recommending she start Flonase and Astelin to help with the PND and continue her antihistamine and OTC medication. She is hesitant to start ABX right away, but agreeable to try a few more days of watch and wait after switching nasal sprays to see if she gets any improvement. If not improving, or if symptoms worsen in the nest few days, can start Augmentin. Duration is adequate to treat is bacterial now if patient desires. Giving chloraseptic spray for sore throat relief as well. Continue supportive measures: rest, hydration, warm liquids with honey and lemon, steam shower, OTC cough/cold/analgesics. Patient aware of signs/symptoms requiring further/urgent evaluation.  - phenol (CHLORASEPTIC) 1.4 % LIQD; Use as directed 1 spray in the mouth or throat as needed for throat irritation / pain.  Dispense: 177 mL; Refill: 1 - amoxicillin-clavulanate (AUGMENTIN) 875-125 MG tablet; Take 1 tablet by mouth 2 (two) times daily for 10 days.  Dispense: 20 tablet; Refill: 0 - fluticasone (FLONASE) 50 MCG/ACT nasal spray; Place 2 sprays into both nostrils daily.  Dispense: 16 g; Refill: 6 - azelastine (ASTELIN) 0.1 % nasal spray; Place 2 sprays into both nostrils 2 (two) times daily. Use in each nostril as directed  Dispense: 301 mL; Refill: 1   Follow-up if symptoms worsen or fail to improve.   Purcell Nails Olevia Bowens, DNP, FNP-C

## 2021-01-05 NOTE — Patient Instructions (Signed)
Try another 2-3 days of conservative therapy. If no improvement, start antibiotic. Let us know if not improving.

## 2021-01-06 ENCOUNTER — Ambulatory Visit: Payer: 59

## 2021-03-10 ENCOUNTER — Other Ambulatory Visit: Payer: Self-pay | Admitting: Osteopathic Medicine

## 2021-05-13 ENCOUNTER — Telehealth: Payer: Self-pay

## 2021-05-13 MED ORDER — OSELTAMIVIR PHOSPHATE 75 MG PO CAPS: 75.0000 mg | ORAL_CAPSULE | Freq: Every day | ORAL | 0 refills | Status: AC

## 2021-05-13 NOTE — Telephone Encounter (Signed)
Completed.

## 2021-05-13 NOTE — Telephone Encounter (Signed)
Patient's husband requested prophylactic Tamiflu for his spouse. Pls send to [facility].

## 2021-05-13 NOTE — Telephone Encounter (Signed)
Task completed. Patient advised that Tamilflu rx sent to the pharmacy. No other inquiries during the call.

## 2021-06-19 ENCOUNTER — Other Ambulatory Visit: Payer: Self-pay

## 2021-06-19 MED ORDER — MECLIZINE HCL 25 MG PO TABS: 25.0000 mg | ORAL_TABLET | Freq: Three times a day (TID) | ORAL | 0 refills | Status: DC | PRN

## 2021-07-08 ENCOUNTER — Ambulatory Visit (INDEPENDENT_AMBULATORY_CARE_PROVIDER_SITE_OTHER): Payer: 59 | Admitting: Family Medicine

## 2021-07-08 ENCOUNTER — Encounter: Payer: Self-pay | Admitting: Family Medicine

## 2021-07-08 ENCOUNTER — Other Ambulatory Visit: Payer: Self-pay

## 2021-07-08 DIAGNOSIS — R42 Dizziness and giddiness: Secondary | ICD-10-CM | POA: Diagnosis not present

## 2021-07-08 NOTE — Progress Notes (Signed)
?Christine Fuentes - 36 y.o. female MRN [id number]  Date of birth: 1985-07-13 ? ?Subjective ?Chief Complaint  ?Patient presents with  ? New Patient (Initial Visit)  ? ? ?HPI ?Christine Fuentes  is a 36 year old female here today for transfer of care visit.  Overall she has been in good health.  She does have problems with seasonal allergies and vertigo.  She is controlling this with Nurtec and Astelin daily.  She does use meclizine as needed.  Stable symptoms at this time. ? ?ROS:  A comprehensive ROS was completed and negative except as noted per HPI ? ?No Known Allergies ? ?Past Medical History:  ?Diagnosis Date  ? Allergy   ? Family history of breast cancer   ? Family history of ovarian cancer   ? GERD (gastroesophageal reflux disease)   ? occ _ TUMS PRN   ? Neuromuscular disorder ([place])   ? pinched nerve but received injections and resolved   ? ? ?Past Surgical History:  ?Procedure Laterality Date  ? CESAREAN SECTION    ? x2  ? injection in back    ? WISDOM TOOTH EXTRACTION    ? ? ?Social History  ? ?Socioeconomic History  ? Marital status: Married  ?  Spouse name: Not on file  ? Number of children: 2  ? Years of education: Not on file  ? Highest education level: Not on file  ?Occupational History  ? Not on file  ?Tobacco Use  ? Smoking status: Never  ? Smokeless tobacco: Never  ?Vaping Use  ? Vaping Use: Never used  ?Substance and Sexual Activity  ? Alcohol use: Not Currently  ? Drug use: Never  ? Sexual activity: Yes  ?  Partners: Male  ?  Birth control/protection: None  ?Other Topics Concern  ? Not on file  ?Social History Narrative  ? Not on file  ? ?Social Determinants of Health  ? ?Financial Resource Strain: Not on file  ?Food Insecurity: Not on file  ?Transportation Needs: Not on file  ?Physical Activity: Not on file  ?Stress: Not on file  ?Social Connections: Not on file  ? ? ?Family History  ?Problem Relation Age of Onset  ? Hypertension Mother   ? Colon polyps Mother   ? Hypertension Father   ? Colon polyps Father    ? Cancer Brother 17  ?     appendix   ? Colon cancer Brother   ? Diabetes Maternal Grandfather   ? Breast cancer Maternal Grandmother   ?     dx in her 33s  ? Ovarian cancer Maternal Grandmother   ?     dx in her 48s  ? Lupus Paternal Grandmother   ? Dementia Paternal Grandfather   ? Esophageal cancer Neg Hx   ? Rectal cancer Neg Hx   ? Stomach cancer Neg Hx   ? ? ?Health Maintenance  ?Topic Date Due  ? Hepatitis C Screening  Never done  ? TETANUS/TDAP  Never done  ? PAP SMEAR-Modifier  05/10/2017  ? COVID-19 Vaccine (3 - Pfizer risk series) 09/26/2019  ? INFLUENZA VACCINE  12/08/2020  ? COLONOSCOPY (Pts 45-43yrs Insurance coverage will need to be confirmed)  04/16/2023  ? HIV Screening  Completed  ? HPV VACCINES  Aged Out  ? ? ? ?----------------------------------------------------------------------------------------------------------------------------------------------------------------------------------------------------------------- ?Physical Exam ?BP 105/70   Pulse 70   Temp 98 ?F (36.7 ?C)   Ht 5\' 2"  (1.575 m)   Wt 174 lb (78.9 kg)   SpO2 98%  BMI 31.83 kg/m?  ? ?Physical Exam ?Constitutional:   ?   Appearance: Normal appearance.  ?Musculoskeletal:  ?   Cervical back: Neck supple.  ?Neurological:  ?   Mental Status: She is alert.  ?Psychiatric:     ?   Mood and Affect: Mood normal.     ?   Behavior: Behavior normal.  ? ? ?------------------------------------------------------------------------------------------------------------------------------------------------------------------------------------------------------------------- ?Assessment and Plan ? ?Vertigo ?Doing well with current medications for management of vertigo.  She will continue Zyrtec and Astelin daily.  Meclizine as needed. ? ? ?No orders of the defined types were placed in this encounter. ? ? ?No follow-ups on file. ? ? ? ?This visit occurred during the SARS-CoV-2 public health emergency.  Safety protocols were in place, including  screening questions prior to the visit, additional usage of staff PPE, and extensive cleaning of exam room while observing appropriate contact time as indicated for disinfecting solutions.  ? ?

## 2021-07-08 NOTE — Assessment & Plan Note (Signed)
Doing well with current medications for management of vertigo.  She will continue Zyrtec and Astelin daily.  Meclizine as needed. ?

## 2021-09-21 ENCOUNTER — Other Ambulatory Visit: Payer: Self-pay | Admitting: Family Medicine

## 2021-11-16 ENCOUNTER — Encounter: Payer: Self-pay | Admitting: Family Medicine

## 2021-11-16 ENCOUNTER — Ambulatory Visit (INDEPENDENT_AMBULATORY_CARE_PROVIDER_SITE_OTHER): Payer: 59 | Admitting: Family Medicine

## 2021-11-16 VITALS — BP 130/78 | HR 74 | Ht 62.0 in | Wt 175.0 lb

## 2021-11-16 DIAGNOSIS — Z23 Encounter for immunization: Secondary | ICD-10-CM

## 2021-11-16 DIAGNOSIS — Z0000 Encounter for general adult medical examination without abnormal findings: Secondary | ICD-10-CM | POA: Insufficient documentation

## 2021-11-16 DIAGNOSIS — Z13220 Encounter for screening for lipoid disorders: Secondary | ICD-10-CM | POA: Diagnosis not present

## 2021-11-16 NOTE — Assessment & Plan Note (Addendum)
Well adult Orders Placed This Encounter  Procedures  . Tdap vaccine greater than or equal to 34yo IM  . COMPLETE METABOLIC PANEL WITH GFR  . CBC with Differential  . Lipid Panel w/reflex Direct LDL  . TSH  Screening: Per lab orders Immunizations:  Tdap given today Anticipatory guidance/Risk factor reduction:  Recommendations per AVS

## 2021-11-16 NOTE — Progress Notes (Signed)
Christine Fuentes - 36 y.o. female MRN [id number]  Date of birth: August 04, 1985  Subjective Chief Complaint  Patient presents with   Annual Exam    HPI Christine Fuentes is a 36 y.o. female here today for annual exam.    Reports that she is doing well at this time.   She stays pretty active with her children.  Feels that diet is pretty healthy.   She is a non-smoker.  Denies EtOH use at this time.   Had colonoscopy previously due to family history of colon cancer.    Seen by dermatology for regular skin checks.   Seen by GYN for cervical cancer screening.    Due for updated Tdap.  ROS:  A comprehensive ROS was completed and negative except as noted per HPI    No Known Allergies  Past Medical History:  Diagnosis Date   Allergy    Family history of breast cancer    Family history of ovarian cancer    GERD (gastroesophageal reflux disease)    occ _ TUMS PRN    Neuromuscular disorder (Door)    pinched nerve but received injections and resolved     Past Surgical History:  Procedure Laterality Date   CESAREAN SECTION     x2   injection in back     WISDOM TOOTH EXTRACTION      Social History   Socioeconomic History   Marital status: Married    Spouse name: Not on file   Number of children: 2   Years of education: Not on file   Highest education level: Not on file  Occupational History   Not on file  Tobacco Use   Smoking status: Never   Smokeless tobacco: Never  Vaping Use   Vaping Use: Never used  Substance and Sexual Activity   Alcohol use: Not Currently   Drug use: Never   Sexual activity: Yes    Partners: Male    Birth control/protection: None  Other Topics Concern   Not on file  Social History Narrative   Not on file   Social Determinants of Health   Financial Resource Strain: Not on file  Food Insecurity: Not on file  Transportation Needs: Not on file  Physical Activity: Not on file  Stress: Not on file  Social Connections: Not on file    Family  History  Problem Relation Age of Onset   Hypertension Mother    Colon polyps Mother    Hypertension Father    Colon polyps Father    Cancer Brother 24       appendix    Colon cancer Brother    Diabetes Maternal Grandfather    Breast cancer Maternal Grandmother        dx in her 66s   Ovarian cancer Maternal Grandmother        dx in her 64s   Lupus Paternal Grandmother    Dementia Paternal Grandfather    Esophageal cancer Neg Hx    Rectal cancer Neg Hx    Stomach cancer Neg Hx     Health Maintenance  Topic Date Due   COVID-19 Vaccine (3 - Pfizer risk series) 12/02/2021 (Originally 09/26/2019)   PAP SMEAR-Modifier  04/18/2022 (Originally 05/10/2017)   TETANUS/TDAP  11/17/2022 (Originally 02/15/2007)   Hepatitis C Screening  11/17/2022 (Originally 02/14/2006)   INFLUENZA VACCINE  12/08/2021   COLONOSCOPY (Pts 45-20yr Insurance coverage will need to be confirmed)  04/16/2023   HIV Screening  Completed   HPV VACCINES  Aged Out     ----------------------------------------------------------------------------------------------------------------------------------------------------------------------------------------------------------------- Physical Exam BP 130/78 (BP Location: Left Arm, Patient Position: Sitting, Cuff Size: Normal)   Pulse 74   Ht '5\' 2"'$  (1.575 m)   Wt 175 lb (79.4 kg)   SpO2 97%   BMI 32.01 kg/m   Physical Exam Constitutional:      General: She is not in acute distress.    Appearance: Normal appearance.  HENT:     Head: Normocephalic and atraumatic.     Right Ear: Tympanic membrane and ear canal normal.     Left Ear: Tympanic membrane and ear canal normal.     Nose: Nose normal.  Eyes:     General: No scleral icterus.    Conjunctiva/sclera: Conjunctivae normal.  Neck:     Thyroid: No thyromegaly.  Cardiovascular:     Rate and Rhythm: Normal rate and regular rhythm.     Heart sounds: Normal heart sounds.  Pulmonary:     Effort: Pulmonary effort is  normal.     Breath sounds: Normal breath sounds.  Abdominal:     General: Bowel sounds are normal. There is no distension.     Palpations: Abdomen is soft.     Tenderness: There is no abdominal tenderness. There is no guarding.  Musculoskeletal:        General: Normal range of motion.     Cervical back: Normal range of motion and neck supple.  Lymphadenopathy:     Cervical: No cervical adenopathy.  Skin:    General: Skin is warm and dry.     Findings: No rash.  Neurological:     General: No focal deficit present.     Mental Status: She is alert and oriented to person, place, and time.     Cranial Nerves: No cranial nerve deficit.     Coordination: Coordination normal.  Psychiatric:        Mood and Affect: Mood normal.        Behavior: Behavior normal.     ------------------------------------------------------------------------------------------------------------------------------------------------------------------------------------------------------------------- Assessment and Plan  Well adult exam Well adult Orders Placed This Encounter  Procedures   Tdap vaccine greater than or equal to 7yo IM   COMPLETE METABOLIC PANEL WITH GFR   CBC with Differential   Lipid Panel w/reflex Direct LDL   TSH  Screening: Per lab orders Immunizations:  Tdap given today Anticipatory guidance/Risk factor reduction:  Recommendations per AVS   No orders of the defined types were placed in this encounter.   No follow-ups on file.    This visit occurred during the SARS-CoV-2 public health emergency.  Safety protocols were in place, including screening questions prior to the visit, additional usage of staff PPE, and extensive cleaning of exam room while observing appropriate contact time as indicated for disinfecting solutions.

## 2021-11-16 NOTE — Patient Instructions (Signed)

## 2021-11-18 LAB — COMPLETE METABOLIC PANEL WITH GFR
AG Ratio: 2.3 (calc) (ref 1.0–2.5)
ALT: 11 U/L (ref 6–29)
AST: 14 U/L (ref 10–30)
Albumin: 4.8 g/dL (ref 3.6–5.1)
Alkaline phosphatase (APISO): 43 U/L (ref 31–125)
BUN: 15 mg/dL (ref 7–25)
CO2: 26 mmol/L (ref 20–32)
Calcium: 9.8 mg/dL (ref 8.6–10.2)
Chloride: 105 mmol/L (ref 98–110)
Creat: 0.82 mg/dL (ref 0.50–0.97)
Globulin: 2.1 g/dL (calc) (ref 1.9–3.7)
Glucose, Bld: 74 mg/dL (ref 65–99)
Potassium: 4.7 mmol/L (ref 3.5–5.3)
Sodium: 138 mmol/L (ref 135–146)
Total Bilirubin: 0.6 mg/dL (ref 0.2–1.2)
Total Protein: 6.9 g/dL (ref 6.1–8.1)
eGFR: 97 mL/min/{1.73_m2} (ref >=60)

## 2021-11-18 LAB — CBC WITH DIFFERENTIAL/PLATELET
Absolute Monocytes: 443 cells/uL (ref 200–950)
Basophils Absolute: 38 cells/uL (ref 0–200)
Basophils Relative: 0.7 %
Eosinophils Absolute: 92 cells/uL (ref 15–500)
Eosinophils Relative: 1.7 %
HCT: 43.1 % (ref 35.0–45.0)
Hemoglobin: 14.5 g/dL (ref 11.7–15.5)
Lymphs Abs: 1825 cells/uL (ref 850–3900)
MCH: 30 pg (ref 27.0–33.0)
MCHC: 33.6 g/dL (ref 32.0–36.0)
MCV: 89 fL (ref 80.0–100.0)
MPV: 10.9 fL (ref 7.5–12.5)
Monocytes Relative: 8.2 %
Neutro Abs: 3002 cells/uL (ref 1500–7800)
Neutrophils Relative %: 55.6 %
Platelets: 270 10*3/uL (ref 140–400)
RBC: 4.84 10*6/uL (ref 3.80–5.10)
RDW: 12.6 % (ref 11.0–15.0)
Total Lymphocyte: 33.8 %
WBC: 5.4 10*3/uL (ref 3.8–10.8)

## 2021-11-18 LAB — LIPID PANEL W/REFLEX DIRECT LDL
Cholesterol: 158 mg/dL (ref <200)
HDL: 60 mg/dL (ref >=50)
LDL Cholesterol (Calc): 82 mg/dL (calc)
Non-HDL Cholesterol (Calc): 98 mg/dL (calc) (ref <130)
Total CHOL/HDL Ratio: 2.6 (calc) (ref <5.0)
Triglycerides: 79 mg/dL (ref <150)

## 2021-11-18 LAB — TSH: TSH: 0.85 mIU/L

## 2021-12-31 ENCOUNTER — Other Ambulatory Visit: Payer: Self-pay | Admitting: Family Medicine

## 2022-04-03 ENCOUNTER — Other Ambulatory Visit: Payer: Self-pay | Admitting: Family Medicine

## 2022-04-28 LAB — HM PAP SMEAR: HM Pap smear: NEGATIVE

## 2022-07-12 ENCOUNTER — Other Ambulatory Visit: Payer: Self-pay | Admitting: Family Medicine

## 2022-10-11 ENCOUNTER — Other Ambulatory Visit: Payer: Self-pay | Admitting: Family Medicine

## 2022-10-12 NOTE — Telephone Encounter (Signed)
Pls contact the patient to schedule appt for annual in July.   Sending 30 day med refill.

## 2022-10-12 NOTE — Telephone Encounter (Signed)
Called patient Christine Fuentes to call to schedule annual in July, thanks.

## 2022-11-10 ENCOUNTER — Other Ambulatory Visit: Payer: Self-pay | Admitting: Family Medicine

## 2022-11-24 ENCOUNTER — Ambulatory Visit: Payer: 59 | Admitting: Family Medicine

## 2022-11-24 ENCOUNTER — Encounter: Payer: Self-pay | Admitting: Family Medicine

## 2022-11-24 VITALS — BP 103/72 | HR 73 | Ht 62.0 in | Wt 163.0 lb

## 2022-11-24 DIAGNOSIS — Z0000 Encounter for general adult medical examination without abnormal findings: Secondary | ICD-10-CM

## 2022-11-24 DIAGNOSIS — Z13220 Encounter for screening for lipoid disorders: Secondary | ICD-10-CM | POA: Diagnosis not present

## 2022-11-24 MED ORDER — MECLIZINE HCL 25 MG PO TABS: 25.0000 mg | ORAL_TABLET | Freq: Three times a day (TID) | ORAL | 3 refills | Status: DC | PRN

## 2022-11-24 NOTE — Patient Instructions (Signed)

## 2022-11-24 NOTE — Progress Notes (Signed)
Christine Fuentes - 37 y.o. female MRN [id number]  Date of birth: 22-Dec-1985  Subjective Chief Complaint  Patient presents with   Annual Exam    HPI Christine Fuentes is a 37 y.o. female here today for annual exam.   She reports that she doing well.   Activity level is moderate, she is exercising more over the past month.  She feels that her diet is pretty good most of the time.   She is a non-smoker.  No EtOH use at this time.   She had her PAP at [facility]  Review of Systems  Constitutional:  Negative for chills, fever, malaise/fatigue and weight loss.  HENT:  Negative for congestion, ear pain and sore throat.   Eyes:  Negative for blurred vision, double vision and pain.  Respiratory:  Negative for cough and shortness of breath.   Cardiovascular:  Negative for chest pain and palpitations.  Gastrointestinal:  Negative for abdominal pain, blood in stool, constipation, heartburn and nausea.  Genitourinary:  Negative for dysuria and urgency.  Musculoskeletal:  Negative for joint pain and myalgias.  Neurological:  Negative for dizziness and headaches.  Endo/Heme/Allergies:  Does not bruise/bleed easily.  Psychiatric/Behavioral:  Negative for depression. The patient is not nervous/anxious and does not have insomnia.     No Known Allergies  Past Medical History:  Diagnosis Date   Allergy    Family history of breast cancer    Family history of ovarian cancer    GERD (gastroesophageal reflux disease)    occ _ TUMS PRN    Neuromuscular disorder (HCC)    pinched nerve but received injections and resolved     Past Surgical History:  Procedure Laterality Date   CESAREAN SECTION     x2   injection in back     WISDOM TOOTH EXTRACTION      Social History   Socioeconomic History   Marital status: Married    Spouse name: Not on file   Number of children: 2   Years of education: Not on file   Highest education level: Not on file  Occupational History   Not on file  Tobacco  Use   Smoking status: Never   Smokeless tobacco: Never  Vaping Use   Vaping status: Never Used  Substance and Sexual Activity   Alcohol use: Not Currently   Drug use: Never   Sexual activity: Yes    Partners: Male    Birth control/protection: None  Other Topics Concern   Not on file  Social History Narrative   Not on file   Social Determinants of Health   Financial Resource Strain: Not on file  Food Insecurity: Not on file  Transportation Needs: Not on file  Physical Activity: Not on file  Stress: Not on file  Social Connections: Unknown (09/15/2021)   Received from [facility]   Social Network    Social Network: Not on file    Family History  Problem Relation Age of Onset   Hypertension Mother    Colon polyps Mother    Hypertension Father    Colon polyps Father    Cancer Brother 44       appendix    Colon cancer Brother    Diabetes Maternal Grandfather    Breast cancer Maternal Grandmother        dx in her 51s   Ovarian cancer Maternal Grandmother        dx in her 71s   Lupus Paternal Grandmother  Dementia Paternal Grandfather    Esophageal cancer Neg Hx    Rectal cancer Neg Hx    Stomach cancer Neg Hx     Health Maintenance  Topic Date Due   PAP SMEAR-Modifier  02/08/2023 (Originally 05/10/2017)   COVID-19 Vaccine (3 - Pfizer risk series) 02/21/2023 (Originally 09/26/2019)   Hepatitis C Screening  11/24/2023 (Originally 02/14/2006)   INFLUENZA VACCINE  12/09/2022   Colonoscopy  04/16/2023   DTaP/Tdap/Td (2 - Td or Tdap) 11/17/2031   HIV Screening  Completed   HPV VACCINES  Aged Out     ----------------------------------------------------------------------------------------------------------------------------------------------------------------------------------------------------------------- Physical Exam BP 103/72 (BP Location: Right Arm, Patient Position: Sitting, Cuff Size: Large)   Pulse 73   Ht 5\' 2"  (1.575 m)   Wt 163 lb (73.9  kg)   SpO2 95%   BMI 29.81 kg/m   Physical Exam Constitutional:      General: She is not in acute distress. HENT:     Head: Normocephalic and atraumatic.     Right Ear: Tympanic membrane and ear canal normal.     Left Ear: Tympanic membrane and ear canal normal.     Nose: Nose normal.     Mouth/Throat:     Mouth: Mucous membranes are moist.  Eyes:     General: No scleral icterus.    Conjunctiva/sclera: Conjunctivae normal.  Neck:     Thyroid: No thyromegaly.  Cardiovascular:     Rate and Rhythm: Normal rate and regular rhythm.     Heart sounds: Normal heart sounds.  Pulmonary:     Effort: Pulmonary effort is normal.     Breath sounds: Normal breath sounds.  Abdominal:     General: Bowel sounds are normal. There is no distension.     Palpations: Abdomen is soft.     Tenderness: There is no abdominal tenderness. There is no guarding.  Musculoskeletal:        General: Normal range of motion.     Cervical back: Normal range of motion and neck supple.  Lymphadenopathy:     Cervical: No cervical adenopathy.  Skin:    General: Skin is warm and dry.     Findings: No rash.  Neurological:     General: No focal deficit present.     Mental Status: She is alert and oriented to person, place, and time.     Cranial Nerves: No cranial nerve deficit.     Coordination: Coordination normal.  Psychiatric:        Mood and Affect: Mood normal.        Behavior: Behavior normal.     ------------------------------------------------------------------------------------------------------------------------------------------------------------------------------------------------------------------- Assessment and Plan  Well adult exam Well adult Screenings: Declines labs today.   Immunizations:  UTD Anticipatory guidance/Risk factor reduction: Recommendations per AVS.    No orders of the defined types were placed in this encounter.   No follow-ups on file.    This visit occurred  during the SARS-CoV-2 public health emergency.  Safety protocols were in place, including screening questions prior to the visit, additional usage of staff PPE, and extensive cleaning of exam room while observing appropriate contact time as indicated for disinfecting solutions.

## 2022-11-24 NOTE — Addendum Note (Signed)
Addended by: Mammie Lorenzo on: 11/24/2022 10:06 AM   Modules accepted: Orders

## 2022-11-24 NOTE — Assessment & Plan Note (Signed)
Well adult Screenings: Declines labs today.   Immunizations:  UTD Anticipatory guidance/Risk factor reduction: Recommendations per AVS.

## 2022-12-09 ENCOUNTER — Encounter: Payer: Self-pay | Admitting: Family Medicine

## 2023-08-16 ENCOUNTER — Encounter: Payer: Self-pay | Admitting: Gastroenterology

## 2023-10-25 ENCOUNTER — Other Ambulatory Visit: Payer: Self-pay | Admitting: Family Medicine

## 2023-11-25 ENCOUNTER — Encounter: Payer: Self-pay | Admitting: Family Medicine

## 2023-11-25 ENCOUNTER — Ambulatory Visit (INDEPENDENT_AMBULATORY_CARE_PROVIDER_SITE_OTHER): Payer: 59 | Admitting: Family Medicine

## 2023-11-25 VITALS — BP 114/76 | HR 70 | Ht 62.0 in | Wt 134.0 lb

## 2023-11-25 DIAGNOSIS — Z13220 Encounter for screening for lipoid disorders: Secondary | ICD-10-CM

## 2023-11-25 DIAGNOSIS — Z0000 Encounter for general adult medical examination without abnormal findings: Secondary | ICD-10-CM | POA: Diagnosis not present

## 2023-11-25 NOTE — Progress Notes (Signed)
 Christine Fuentes - 38 y.o. female MRN [id number]  Date of birth: 06/16/1985  Subjective Chief Complaint  Patient presents with   Annual Exam    HPI Christine Fuentes is a 39 y.o. female here today for annual exam.   She reports that she is doing well.  Weight is down about 30lbs from last year.  She has made changes to diet and increase her exercise.  She feels good overall.   She is a non-smoker.  She denies EtOH use.   She has colonoscopy scheduled later this year.  Brother with history of colon cancer.   Family history of melanoma-seen by dermatology regularly.   Has regular dental care.   Review of Systems  Constitutional:  Negative for chills, fever, malaise/fatigue and weight loss.  HENT:  Negative for congestion, ear pain and sore throat.   Eyes:  Negative for blurred vision, double vision and pain.  Respiratory:  Negative for cough and shortness of breath.   Cardiovascular:  Negative for chest pain and palpitations.  Gastrointestinal:  Negative for abdominal pain, blood in stool, constipation, heartburn and nausea.  Genitourinary:  Negative for dysuria and urgency.  Musculoskeletal:  Negative for joint pain and myalgias.  Neurological:  Negative for dizziness and headaches.  Endo/Heme/Allergies:  Does not bruise/bleed easily.  Psychiatric/Behavioral:  Negative for depression. The patient is not nervous/anxious and does not have insomnia.     No Known Allergies  Past Medical History:  Diagnosis Date   Allergy    Family history of breast cancer    Family history of ovarian cancer    GERD (gastroesophageal reflux disease)    occ _ TUMS PRN    Neuromuscular disorder (HCC)    pinched nerve but received injections and resolved     Past Surgical History:  Procedure Laterality Date   CESAREAN SECTION     x2   injection in back     WISDOM TOOTH EXTRACTION      Social History   Socioeconomic History   Marital status: Married    Spouse name: Not on file   Number of  children: 2   Years of education: Not on file   Highest education level: Not on file  Occupational History   Not on file  Tobacco Use   Smoking status: Never   Smokeless tobacco: Never  Vaping Use   Vaping status: Never Used  Substance and Sexual Activity   Alcohol use: Not Currently   Drug use: Never   Sexual activity: Yes    Partners: Male    Birth control/protection: None  Other Topics Concern   Not on file  Social History Narrative   Not on file   Social Drivers of Health   Financial Resource Strain: Low Risk  (11/25/2023)   Overall Financial Resource Strain (CARDIA)    Difficulty of Paying Living Expenses: Not hard at all  Food Insecurity: No Food Insecurity (11/25/2023)   Hunger Vital Sign    Worried About Running Out of Food in the Last Year: Never true    Ran Out of Food in the Last Year: Never true  Transportation Needs: No Transportation Needs (11/25/2023)   PRAPARE - Administrator, Civil Service (Medical): No    Lack of Transportation (Non-Medical): No  Physical Activity: Sufficiently Active (11/25/2023)   Exercise Vital Sign    Days of Exercise per Week: 5 days    Minutes of Exercise per Session: 60 min  Stress: No Stress Concern Present (11/25/2023)  [employer] of Occupational Health - Occupational Stress Questionnaire    Feeling of Stress: Only a little  Social Connections: Socially Integrated (11/25/2023)   Social Connection and Isolation Panel    Frequency of Communication with Friends and Family: More than three times a week    Frequency of Social Gatherings with Friends and Family: Once a week    Attends Religious Services: 1 to 4 times per year    Active Member of Clubs or Organizations: Yes    Attends Banker Meetings: 1 to 4 times per year    Marital Status: Married    Family History  Problem Relation Age of Onset   Hypertension Mother    Colon polyps Mother    Hypertension Father    Colon polyps Father    Cancer  Brother 71       appendix    Colon cancer Brother    Diabetes Maternal Grandfather    Breast cancer Maternal Grandmother        dx in her 72s   Ovarian cancer Maternal Grandmother        dx in her 35s   Lupus Paternal Grandmother    Dementia Paternal Grandfather    Esophageal cancer Neg Hx    Rectal cancer Neg Hx    Stomach cancer Neg Hx     Health Maintenance  Topic Date Due   Hepatitis C Screening  Never done   Hepatitis B Vaccines (1 of 3 - 19+ 3-dose series) Never done   HPV VACCINES (1 - 3-dose SCDM series) Never done   COVID-19 Vaccine (3 - 2024-25 season) 01/09/2023   Colonoscopy  04/16/2023   INFLUENZA VACCINE  12/09/2023   Cervical Cancer Screening (HPV/Pap Cotest)  04/28/2025   DTaP/Tdap/Td (2 - Td or Tdap) 11/17/2031   HIV Screening  Completed   Meningococcal B Vaccine  Aged Out     ----------------------------------------------------------------------------------------------------------------------------------------------------------------------------------------------------------------- Physical Exam BP 114/76 (BP Location: Left Arm, Patient Position: Sitting, Cuff Size: Small)   Pulse 70   Ht 5' 2 (1.575 m)   Wt 134 lb (60.8 kg)   SpO2 100%   BMI 24.51 kg/m   Physical Exam Constitutional:      General: She is not in acute distress. HENT:     Head: Normocephalic and atraumatic.     Right Ear: Tympanic membrane and ear canal normal.     Left Ear: Tympanic membrane and ear canal normal.     Nose: Nose normal.  Eyes:     General: No scleral icterus.    Conjunctiva/sclera: Conjunctivae normal.  Neck:     Thyroid: No thyromegaly.  Cardiovascular:     Rate and Rhythm: Normal rate and regular rhythm.     Heart sounds: Normal heart sounds.  Pulmonary:     Effort: Pulmonary effort is normal.     Breath sounds: Normal breath sounds.  Abdominal:     General: Bowel sounds are normal. There is no distension.     Palpations: Abdomen is soft.      Tenderness: There is no abdominal tenderness. There is no guarding.  Musculoskeletal:        General: Normal range of motion.     Cervical back: Normal range of motion and neck supple.  Lymphadenopathy:     Cervical: No cervical adenopathy.  Skin:    General: Skin is warm and dry.     Findings: No rash.  Neurological:     General: No focal deficit present.  Mental Status: She is alert and oriented to person, place, and time.     Cranial Nerves: No cranial nerve deficit.     Coordination: Coordination normal.  Psychiatric:        Mood and Affect: Mood normal.        Behavior: Behavior normal.     ------------------------------------------------------------------------------------------------------------------------------------------------------------------------------------------------------------------- Assessment and Plan  Well adult exam Well adult Screenings:  Orders Placed This Encounter  Procedures   CMP14+EGFR   CBC with Differential/Platelet   Lipid Panel With LDL/HDL Ratio    Immunizations:  UTD Anticipatory guidance/Risk factor reduction: Recommendations per AVS.    No orders of the defined types were placed in this encounter.   No follow-ups on file.

## 2023-11-25 NOTE — Patient Instructions (Signed)

## 2023-11-25 NOTE — Assessment & Plan Note (Addendum)
 Well adult Screenings:  Orders Placed This Encounter  Procedures   CMP14+EGFR   CBC with Differential/Platelet   Lipid Panel With LDL/HDL Ratio    Immunizations:  UTD Anticipatory guidance/Risk factor reduction: Recommendations per AVS.

## 2023-11-26 LAB — LIPID PANEL WITH LDL/HDL RATIO
Cholesterol, Total: 165 mg/dL (ref 100–199)
HDL: 70 mg/dL (ref >39)
LDL Chol Calc (NIH): 77 mg/dL (ref 0–99)
LDL/HDL Ratio: 1.1 ratio (ref 0.0–3.2)
Triglycerides: 97 mg/dL (ref 0–149)
VLDL Cholesterol Cal: 18 mg/dL (ref 5–40)

## 2023-11-26 LAB — CBC WITH DIFFERENTIAL/PLATELET
Basophils Absolute: 0.1 x10E3/uL (ref 0.0–0.2)
Basos: 1 %
EOS (ABSOLUTE): 0.1 x10E3/uL (ref 0.0–0.4)
Eos: 3 %
Hematocrit: 44.3 % (ref 34.0–46.6)
Hemoglobin: 14.4 g/dL (ref 11.1–15.9)
Immature Grans (Abs): 0 x10E3/uL (ref 0.0–0.1)
Immature Granulocytes: 0 %
Lymphocytes Absolute: 1.7 x10E3/uL (ref 0.7–3.1)
Lymphs: 40 %
MCH: 29.9 pg (ref 26.6–33.0)
MCHC: 32.5 g/dL (ref 31.5–35.7)
MCV: 92 fL (ref 79–97)
Monocytes Absolute: 0.3 x10E3/uL (ref 0.1–0.9)
Monocytes: 7 %
Neutrophils Absolute: 2.1 x10E3/uL (ref 1.4–7.0)
Neutrophils: 49 %
Platelets: 248 x10E3/uL (ref 150–450)
RBC: 4.81 x10E6/uL (ref 3.77–5.28)
RDW: 12.3 % (ref 11.7–15.4)
WBC: 4.2 x10E3/uL (ref 3.4–10.8)

## 2023-11-26 LAB — CMP14+EGFR
ALT: 21 IU/L (ref 0–32)
AST: 21 IU/L (ref 0–40)
Albumin: 4.6 g/dL (ref 3.9–4.9)
Alkaline Phosphatase: 51 IU/L (ref 44–121)
BUN/Creatinine Ratio: 18 (ref 9–23)
BUN: 16 mg/dL (ref 6–20)
Bilirubin Total: 0.5 mg/dL (ref 0.0–1.2)
CO2: 21 mmol/L (ref 20–29)
Calcium: 9.6 mg/dL (ref 8.7–10.2)
Chloride: 105 mmol/L (ref 96–106)
Creatinine, Ser: 0.89 mg/dL (ref 0.57–1.00)
Globulin, Total: 2 g/dL (ref 1.5–4.5)
Glucose: 58 mg/dL — ABNORMAL LOW (ref 70–99)
Potassium: 4.3 mmol/L (ref 3.5–5.2)
Sodium: 140 mmol/L (ref 134–144)
Total Protein: 6.6 g/dL (ref 6.0–8.5)
eGFR: 87 mL/min/1.73 (ref >59)

## 2023-11-28 ENCOUNTER — Encounter: Payer: Self-pay | Admitting: Gastroenterology

## 2023-11-28 ENCOUNTER — Ambulatory Visit (AMBULATORY_SURGERY_CENTER): Admitting: *Deleted

## 2023-11-28 VITALS — Ht 62.0 in | Wt 134.0 lb

## 2023-11-28 DIAGNOSIS — Z860100 Personal history of colon polyps, unspecified: Secondary | ICD-10-CM

## 2023-11-28 DIAGNOSIS — Z800 Family history of malignant neoplasm of digestive organs: Secondary | ICD-10-CM

## 2023-11-28 MED ORDER — NA SULFATE-K SULFATE-MG SULF 17.5-3.13-1.6 GM/177ML PO SOLN: 1.0000 | Freq: Once | ORAL | 0 refills | Status: AC

## 2023-11-28 NOTE — Progress Notes (Signed)
 Pre visit completed over telephone. Instructions sent through [place] and secure email.   No egg or soy allergy known to patient  No issues known to pt with past sedation with any surgeries or procedures Patient denies ever being told they had issues or difficulty with intubation  No FH of Malignant Hyperthermia Pt is not on diet pills Pt is not on  home 02  Pt is not on blood thinners  Pt denies issues with constipation  No A fib or A flutter Have any cardiac testing pending-- no Pt instructed to use [email or web address] or GoodRx for a price reduction on prep

## 2023-12-02 ENCOUNTER — Ambulatory Visit: Payer: Self-pay | Admitting: Family Medicine

## 2023-12-07 ENCOUNTER — Encounter: Payer: Self-pay | Admitting: Gastroenterology

## 2023-12-07 ENCOUNTER — Ambulatory Visit (AMBULATORY_SURGERY_CENTER): Admitting: Gastroenterology

## 2023-12-07 VITALS — BP 108/72 | HR 58 | Temp 98.1°F | Resp 14 | Ht 62.0 in | Wt 134.0 lb

## 2023-12-07 DIAGNOSIS — Z1211 Encounter for screening for malignant neoplasm of colon: Secondary | ICD-10-CM | POA: Diagnosis present

## 2023-12-07 DIAGNOSIS — Z800 Family history of malignant neoplasm of digestive organs: Secondary | ICD-10-CM | POA: Diagnosis not present

## 2023-12-07 DIAGNOSIS — Z860101 Personal history of adenomatous and serrated colon polyps: Secondary | ICD-10-CM

## 2023-12-07 MED ORDER — SODIUM CHLORIDE 0.9 % IV SOLN: 500.0000 mL | Freq: Once | INTRAVENOUS | Status: DC

## 2023-12-07 NOTE — Progress Notes (Signed)
 Pt sedate, gd SR's, VSS, report to RN

## 2023-12-07 NOTE — Progress Notes (Signed)
 GASTROENTEROLOGY PROCEDURE H&P NOTE   Primary Care Physician: Alvia Bring, DO    Reason for Procedure:  Colon polyp surveillance, family history of colon cancer  Plan:    Colonoscopy  Patient is appropriate for endoscopic procedure(s) in the ambulatory ([facility]) setting.  The nature of the procedure, as well as the risks, benefits, and alternatives were carefully and thoroughly reviewed with the patient. Ample time for discussion and questions allowed. The patient understood, was satisfied, and agreed to proceed.     HPI: Christine Fuentes is a 38 y.o. female who presents for colonoscopy for ongoing colon polyp surveillance and increased risk colon cancer screening.  No active GI symptoms.    Family history notable for brother diagnosed at age 84 with metastatic appendical CA- Signet Ring Mucinous Adenocarcinoma.  Additionally, her maternal uncle was recently diagnosed with gastric cancer (not signet ring).  Last colonoscopy was 04/2020 and notable for 2 small 4-6 mm polyps removed from the transverse colon and cecum (path: SSP).  Given family history of signet ring adenocarcinoma, recommended repeat in 3 years for continued surveillance.  She was evaluated in the [facility] in 05/2020 but patient ultimately decided to not undergo genetic testing.  No recent abdominal imaging for review.  No recent labs for review.  Past Medical History:  Diagnosis Date   Allergy    Family history of breast cancer    Family history of ovarian cancer    Neuromuscular disorder (HCC)    pinched nerve but received injections and resolved     Past Surgical History:  Procedure Laterality Date   CESAREAN SECTION     x2   injection in back     WISDOM TOOTH EXTRACTION      Prior to Admission medications   Medication Sig Start Date End Date Taking? Authorizing Provider  cetirizine  (ZYRTEC ) 10 MG tablet Take 1 tablet (10 mg total) by mouth daily. 02/18/20   Alexander, Natalie, DO   levonorgestrel (MIRENA) 20 MCG/24HR IUD 1 each by Intrauterine route once. Placed 10/2016    [provider]  meclizine  (ANTIVERT ) 25 MG tablet TAKE 1 TABLET BY MOUTH THREE TIMES A DAY AS NEEDED 10/25/23   Alvia Bring, DO  tretinoin (RETIN-A) 0.025 % cream Apply topically. 10/13/23   [provider]    Current Outpatient Medications  Medication Sig Dispense Refill   cetirizine  (ZYRTEC ) 10 MG tablet Take 1 tablet (10 mg total) by mouth daily. 90 tablet 3   levonorgestrel (MIRENA) 20 MCG/24HR IUD 1 each by Intrauterine route once. Placed 10/2016     meclizine  (ANTIVERT ) 25 MG tablet TAKE 1 TABLET BY MOUTH THREE TIMES A DAY AS NEEDED 90 tablet 3   tretinoin (RETIN-A) 0.025 % cream Apply topically.     Current Facility-Administered Medications  Medication Dose Route Frequency Provider Last Rate Last Admin   0.9 %  sodium chloride  infusion  500 mL Intravenous Once Jerilynn Feldmeier V, DO        Allergies as of 12/07/2023   (No Known Allergies)    Family History  Problem Relation Age of Onset   Hypertension Mother    Colon polyps Mother    Hypertension Father    Colon polyps Father    Cancer Brother 40       appendix    Colon cancer Brother    Stomach cancer Maternal Uncle    Breast cancer Maternal Grandmother        dx in her 49s   Ovarian cancer  Maternal Grandmother        dx in her 66s   Diabetes Maternal Grandfather    Lupus Paternal Grandmother    Dementia Paternal Grandfather    Esophageal cancer Neg Hx    Rectal cancer Neg Hx     Social History   Socioeconomic History   Marital status: Married    Spouse name: Not on file   Number of children: 2   Years of education: Not on file   Highest education level: Not on file  Occupational History   Not on file  Tobacco Use   Smoking status: Never   Smokeless tobacco: Never  Vaping Use   Vaping status: Never Used  Substance and Sexual Activity   Alcohol use: Not Currently   Drug use: Never    Sexual activity: Yes    Partners: Male    Birth control/protection: None  Other Topics Concern   Not on file  Social History Narrative   Not on file   Social Drivers of Health   Financial Resource Strain: Low Risk  (11/25/2023)   Overall Financial Resource Strain (CARDIA)    Difficulty of Paying Living Expenses: Not hard at all  Food Insecurity: No Food Insecurity (11/25/2023)   Hunger Vital Sign    Worried About Running Out of Food in the Last Year: Never true    Ran Out of Food in the Last Year: Never true  Transportation Needs: No Transportation Needs (11/25/2023)   PRAPARE - Administrator, Civil Service (Medical): No    Lack of Transportation (Non-Medical): No  Physical Activity: Sufficiently Active (11/25/2023)   Exercise Vital Sign    Days of Exercise per Week: 5 days    Minutes of Exercise per Session: 60 min  Stress: No Stress Concern Present (11/25/2023)   [employer] of Occupational Health - Occupational Stress Questionnaire    Feeling of Stress: Only a little  Social Connections: Socially Integrated (11/25/2023)   Social Connection and Isolation Panel    Frequency of Communication with Friends and Family: More than three times a week    Frequency of Social Gatherings with Friends and Family: Once a week    Attends Religious Services: 1 to 4 times per year    Active Member of [employer] or Organizations: Yes    Attends Banker Meetings: 1 to 4 times per year    Marital Status: Married  Catering manager Violence: Not At Risk (11/25/2023)   Humiliation, Afraid, Rape, and Kick questionnaire    Fear of Current or Ex-Partner: No    Emotionally Abused: No    Physically Abused: No    Sexually Abused: No    Physical Exam: Vital signs in last 24 hours: @BP  130/82   Pulse 69   Temp 98.1 F (36.7 C)   Ht 5' 2 (1.575 m)   Wt 134 lb (60.8 kg)   SpO2 100%   BMI 24.51 kg/m  GEN: NAD EYE: Sclerae anicteric ENT: MMM CV: Non-tachycardic Pulm: CTA  b/l GI: Soft, NT/ND NEURO:  Alert & Oriented x 3   Sandor Flatter, DO [facility]   12/07/2023 10:20 AM

## 2023-12-07 NOTE — Progress Notes (Signed)
 Pt's states no medical or surgical changes since previsit or office visit.

## 2023-12-07 NOTE — Patient Instructions (Signed)
 Please read handouts provided. Continue present medications. Resume previous diet. Repeat colonoscopy for screening in 3 years. Return to GI office at appointment to be scheduled.   YOU HAD AN ENDOSCOPIC PROCEDURE TODAY AT THE [facility]:   Refer to the procedure report that was given to you for any specific questions about what was found during the examination.  If the procedure report does not answer your questions, please call your gastroenterologist to clarify.  If you requested that your care partner not be given the details of your procedure findings, then the procedure report has been included in a sealed envelope for you to review at your convenience later.  YOU SHOULD EXPECT: Some feelings of bloating in the abdomen. Passage of more gas than usual.  Walking can help get rid of the air that was put into your GI tract during the procedure and reduce the bloating. If you had a lower endoscopy (such as a colonoscopy or flexible sigmoidoscopy) you may notice spotting of blood in your stool or on the toilet paper. If you underwent a bowel prep for your procedure, you may not have a normal bowel movement for a few days.  Please Note:  You might notice some irritation and congestion in your nose or some drainage.  This is from the oxygen used during your procedure.  There is no need for concern and it should clear up in a day or so.  SYMPTOMS TO REPORT IMMEDIATELY:  Following lower endoscopy (colonoscopy or flexible sigmoidoscopy):  Excessive amounts of blood in the stool  Significant tenderness or worsening of abdominal pains  Swelling of the abdomen that is new, acute  Fever of 100F or higher.  For urgent or emergent issues, a gastroenterologist can be reached at any hour by calling [phone]. Do not use MyChart messaging for urgent concerns.    DIET:  We do recommend a small meal at first, but then you may proceed to your regular diet.  Drink plenty of fluids but you  should avoid alcoholic beverages for 24 hours.  ACTIVITY:  You should plan to take it easy for the rest of today and you should NOT DRIVE or use heavy machinery until tomorrow (because of the sedation medicines used during the test).    FOLLOW UP: Our staff will call the number listed on your records the next business day following your procedure.  We will call around 7:15- 8:00 am to check on you and address any questions or concerns that you may have regarding the information given to you following your procedure. If we do not reach you, we will leave a message.     If any biopsies were taken you will be contacted by phone or by letter within the next 1-3 weeks.  Please call us  at [phone] if you have not heard about the biopsies in 3 weeks.    SIGNATURES/CONFIDENTIALITY: You and/or your care partner have signed paperwork which will be entered into your electronic medical record.  These signatures attest to the fact that that the information above on your After Visit Summary has been reviewed and is understood.  Full responsibility of the confidentiality of this discharge information lies with you and/or your care-partner.

## 2023-12-07 NOTE — Op Note (Signed)
 [facility] Patient Name: Christine Fuentes Procedure Date: 12/07/2023 11:25 AM MRN: [id number] Endoscopist: Sandor Flatter , MD, [id number] Age: 38 Referring MD:  Date of Birth: [birth year] Gender: Female Account #: [id number] Procedure:                Colonoscopy Indications:              Screening in patient at increased risk: Colorectal                            cancer in brother before age 73, Surveillance:                            Personal history of adenomatous polyps on last                            colonoscopy 3+ years ago                           Family history notable for brother diagnosed at age                            33 with metastatic appendical CA- Signet Ring                            Mucinous Adenocarcinoma.                           Additionally, her maternal uncle was recently                            diagnosed with gastric cancer (not signet ring).                           Last colonoscopy was 04/2020 and notable for 2                            small 4-6 mm polyps removed from the transverse                            colon and cecum (path: SSP). Given family history                            of signet ring adenocarcinoma, recommended repeat                            in 3 years for continued surveillance. Medicines:                Monitored Anesthesia Care Procedure:                Pre-Anesthesia Assessment:                           - Prior to the procedure, a History and Physical  was performed, and patient medications and                            allergies were reviewed. The patient's tolerance of                            previous anesthesia was also reviewed. The risks                            and benefits of the procedure and the sedation                            options and risks were discussed with the patient.                            All questions were answered, and informed consent                             was obtained. Prior Anticoagulants: The patient has                            taken no anticoagulant or antiplatelet agents. ASA                            Grade Assessment: II - A patient with mild systemic                            disease. After reviewing the risks and benefits,                            the patient was deemed in satisfactory condition to                            undergo the procedure.                           After obtaining informed consent, the colonoscope                            was passed under direct vision. Throughout the                            procedure, the patient's blood pressure, pulse, and                            oxygen saturations were monitored continuously. The                            [id number]-HQ190L Colonoscope [id number] was introduced                            through the anus and advanced to the the terminal  ileum. The colonoscopy was performed without                            difficulty. The patient tolerated the procedure                            well. The quality of the bowel preparation was                            good. The terminal ileum, ileocecal valve,                            appendiceal orifice, and rectum were photographed. Scope In: 11:35:11 AM Scope Out: 11:49:26 AM Scope Withdrawal Time: 0 hours 10 minutes 39 seconds  Total Procedure Duration: 0 hours 14 minutes 15 seconds  Findings:                 The perianal and digital rectal examinations were                            normal.                           The entire colon appeared normal.                           The retroflexed view of the distal rectum and anal                            verge was normal and showed no anal or rectal                            abnormalities.                           The terminal ileum appeared normal. Complications:            No immediate complications. Estimated Blood Loss:      Estimated blood loss: none. Impression:               - The entire examined colon is normal.                           - The distal rectum and anal verge are normal on                            retroflexion view.                           - The examined portion of the ileum was normal.                           - No specimens collected. Recommendation:           - Patient has a contact number available for  emergencies. The signs and symptoms of potential                            delayed complications were discussed with the                            patient. Return to normal activities tomorrow.                            Written discharge instructions were provided to the                            patient.                           - Resume previous diet.                           - Continue present medications.                           - Repeat colonoscopy in 3 years for screening                            purposes.                           - Return to GI office at appointment to be                            scheduled. Will discuss the role/utility of upper                            endoscopy based on family history along with the                            role of cross-sectional imaging (i.e. CT                            abdomen/pelvis). Sandor Flatter, MD 12/07/2023 11:59:33 AM

## 2023-12-08 ENCOUNTER — Telehealth: Payer: Self-pay | Admitting: *Deleted

## 2023-12-08 NOTE — Telephone Encounter (Signed)
 No answer after follow up call. Left a message.

## 2024-01-11 ENCOUNTER — Ambulatory Visit: Admitting: Gastroenterology

## 2024-04-23 LAB — HM PAP SMEAR

## 2024-11-26 ENCOUNTER — Encounter: Admitting: Family Medicine
